# Patient Record
Sex: Male | Born: 1960 | Race: White | Hispanic: No | Marital: Married | Smoking: Former smoker
Health system: Southern US, Community
[De-identification: ages and names within clinical notes are randomized; demographics above are authoritative.]

## PROBLEM LIST (undated history)

## (undated) DIAGNOSIS — I639 Cerebral infarction, unspecified: Secondary | ICD-10-CM

## (undated) DIAGNOSIS — G4720 Circadian rhythm sleep disorder, unspecified type: Secondary | ICD-10-CM

## (undated) DIAGNOSIS — I10 Essential (primary) hypertension: Secondary | ICD-10-CM

## (undated) DIAGNOSIS — E299 Testicular dysfunction, unspecified: Secondary | ICD-10-CM

## (undated) DIAGNOSIS — D509 Iron deficiency anemia, unspecified: Secondary | ICD-10-CM

## (undated) DIAGNOSIS — G40909 Epilepsy, unspecified, not intractable, without status epilepticus: Secondary | ICD-10-CM

## (undated) DIAGNOSIS — F32A Depression, unspecified: Secondary | ICD-10-CM

## (undated) DIAGNOSIS — M961 Postlaminectomy syndrome, not elsewhere classified: Secondary | ICD-10-CM

## (undated) DIAGNOSIS — M419 Scoliosis, unspecified: Secondary | ICD-10-CM

## (undated) DIAGNOSIS — F319 Bipolar disorder, unspecified: Secondary | ICD-10-CM

## (undated) DIAGNOSIS — E785 Hyperlipidemia, unspecified: Secondary | ICD-10-CM

## (undated) DIAGNOSIS — I4891 Unspecified atrial fibrillation: Secondary | ICD-10-CM

## (undated) DIAGNOSIS — F419 Anxiety disorder, unspecified: Secondary | ICD-10-CM

## (undated) DIAGNOSIS — M5481 Occipital neuralgia: Secondary | ICD-10-CM

## (undated) DIAGNOSIS — G4730 Sleep apnea, unspecified: Secondary | ICD-10-CM

## (undated) DIAGNOSIS — M5134 Other intervertebral disc degeneration, thoracic region: Secondary | ICD-10-CM

## (undated) DIAGNOSIS — G894 Chronic pain syndrome: Secondary | ICD-10-CM

## (undated) HISTORY — PX: HERNIA REPAIR: SHX51

## (undated) HISTORY — PX: COLONOSCOPY: SHX174

## (undated) HISTORY — PX: CATARACT EXTRACTION W/ INTRAOCULAR LENS IMPLANT: SHX1309

## (undated) HISTORY — PX: CARDIAC CATHETERIZATION: SHX172

## (undated) HISTORY — PX: UPPER GASTROINTESTINAL ENDOSCOPY: SHX188

## (undated) HISTORY — PX: BACK SURGERY: SHX140

---

## 2021-10-21 ENCOUNTER — Other Ambulatory Visit: Payer: Self-pay | Admitting: Neurosurgery

## 2021-10-21 DIAGNOSIS — Z01818 Encounter for other preprocedural examination: Secondary | ICD-10-CM

## 2021-11-03 ENCOUNTER — Encounter
Admission: RE | Admit: 2021-11-03 | Discharge: 2021-11-03 | Disposition: A | Discharge status: Home / Self Care | Payer: Medicare Other | Source: Ambulatory Visit | Attending: Neurosurgery | Admitting: Neurosurgery

## 2021-11-03 ENCOUNTER — Encounter: Payer: Self-pay | Admitting: Neurosurgery

## 2021-11-03 ENCOUNTER — Other Ambulatory Visit: Payer: Self-pay

## 2021-11-03 HISTORY — DX: Anxiety disorder, unspecified: F41.9

## 2021-11-03 HISTORY — DX: Cerebral infarction, unspecified: I63.9

## 2021-11-03 HISTORY — DX: Unspecified atrial fibrillation: I48.91

## 2021-11-03 HISTORY — DX: Depression, unspecified: F32.A

## 2021-11-03 HISTORY — DX: Other intervertebral disc degeneration, thoracic region: M51.34

## 2021-11-03 HISTORY — DX: Sleep apnea, unspecified: G47.30

## 2021-11-03 HISTORY — DX: Epilepsy, unspecified, not intractable, without status epilepticus: G40.909

## 2021-11-03 HISTORY — DX: Hyperlipidemia, unspecified: E78.5

## 2021-11-03 NOTE — Patient Instructions (Addendum)
Your procedure is scheduled on: 11/14/21 Report to DAY SURGERY DEPARTMENT LOCATED ON 2ND FLOOR MEDICAL MALL ENTRANCE. To find out your arrival time please call [phone] between 1PM - 3PM on 11/11/21.  Remember: Instructions that are not followed completely may result in serious medical risk, up to and including death, or upon the discretion of your surgeon and anesthesiologist your surgery may need to be rescheduled.     _X__ 1. Do not eat food after midnight the night before your procedure.                 No gum chewing or hard candies. You may drink clear liquids up to 2 hours                 before you are scheduled to arrive for your surgery- DO not drink clear                 liquids within 2 hours of the start of your surgery.                 Clear Liquids include:  water, apple juice without pulp, clear carbohydrate                 drink such as Clearfast or Gatorade, Black Coffee or Tea (Do not add                 anything to coffee or tea). Diabetics water only  __X__2.  On the morning of surgery brush your teeth with toothpaste and water, you                 may rinse your mouth with mouthwash if you wish.  Do not swallow any              toothpaste of mouthwash.     _X__ 3.  No Alcohol for 24 hours before or after surgery.   _X__ 4.  Do Not Smoke or use e-cigarettes For 24 Hours Prior to Your Surgery.                 Do not use any chewable tobacco products for at least 6 hours prior to                 surgery.  ____  5.  Bring all medications with you on the day of surgery if instructed.   __X__  6.  Notify your doctor if there is any change in your medical condition      (cold, fever, infections).     Do not wear jewelry, make-up, hairpins, clips or nail polish. Do not wear lotions, powders, or perfumes. No deodorant Do not shave body hair 48 hours prior to surgery. Men may shave face and neck. Do not bring valuables to the hospital.    [facility] is not responsible  for any belongings or valuables.  Contacts, dentures/partials or body piercings may not be worn into surgery. Bring a case for your contacts, glasses or hearing aids, a denture cup will be supplied. Leave your suitcase in the car. After surgery it may be brought to your room. For patients admitted to the hospital, discharge time is determined by your treatment team.   Patients discharged the day of surgery will not be allowed to drive home.   Please read over the following fact sheets that you were given:     __X__ Take these medicines the morning of surgery with A SIP OF WATER:  1. atorvastatin (LIPITOR) 40 MG tablet  2. DULoxetine (CYMBALTA) 60 MG capsule  3. levETIRAcetam (KEPPRA) 500 MG tablet  4. Oxycodone HCl 10 MG TABS  5. hydrOXYzine (ATARAX) 25 MG tablet if needed  6.  ____ Fleet Enema (as directed)   __X__ Use CHG Soap/SAGE wipes as directed  Shower using "orange" bar Dial soap for 3-4 days and the night before and morning of surgery  ____ Use inhalers on the day of surgery  ____ Stop metformin/Janumet/Farxiga 2 days prior to surgery    ____ Take 1/2 of usual insulin dose the night before surgery. No insulin the morning          of surgery.   __X__ Stop Blood Thinners Coumadin/Plavix/Xarelto/Pleta/Pradaxa/Eliquis/Effient/Aspirin Take the last dose of Eliquis Friday am 2/24 then hold until able to resume after surgery (14 days)  __X__ Stop Anti-inflammatories 7 days before surgery such as Advil, Ibuprofen, Motrin,  BC or Goodies Powder, Naprosyn, Naproxen, Aleve, Aspirin    __X__ Stop all herbals and supplements, fish oil or vitamins  until after surgery.    ____ Bring C-Pap to the hospital.

## 2021-11-10 ENCOUNTER — Other Ambulatory Visit: Admission: RE | Admit: 2021-11-10 | Payer: Medicare Other | Source: Ambulatory Visit

## 2021-11-10 NOTE — Progress Notes (Signed)
Due to distance from the hospital, patient to get a rapid covid on the day of surgery 11/14/21

## 2021-11-14 ENCOUNTER — Encounter: Payer: Self-pay | Admitting: Neurosurgery

## 2021-11-14 ENCOUNTER — Other Ambulatory Visit: Payer: Self-pay

## 2021-11-14 ENCOUNTER — Inpatient Hospital Stay
Admission: RE | Admit: 2021-11-14 | Discharge: 2021-11-19 | DRG: 460 | Disposition: A | Discharge status: Home / Self Care | Payer: Medicare Other | Attending: Neurosurgery | Admitting: Neurosurgery

## 2021-11-14 ENCOUNTER — Inpatient Hospital Stay: Payer: Medicare Other | Admitting: Urgent Care

## 2021-11-14 ENCOUNTER — Inpatient Hospital Stay: Payer: Medicare Other

## 2021-11-14 ENCOUNTER — Encounter
Admission: RE | Disposition: A | Discharge status: Home / Self Care | Payer: Self-pay | Source: Home / Self Care | Attending: Neurosurgery

## 2021-11-14 DIAGNOSIS — Y798 Miscellaneous orthopedic devices associated with adverse incidents, not elsewhere classified: Secondary | ICD-10-CM | POA: Diagnosis present

## 2021-11-14 DIAGNOSIS — K5900 Constipation, unspecified: Secondary | ICD-10-CM | POA: Diagnosis not present

## 2021-11-14 DIAGNOSIS — T84226A Displacement of internal fixation device of vertebrae, initial encounter: Secondary | ICD-10-CM | POA: Diagnosis present

## 2021-11-14 DIAGNOSIS — M960 Pseudarthrosis after fusion or arthrodesis: Secondary | ICD-10-CM | POA: Diagnosis present

## 2021-11-14 DIAGNOSIS — Z20822 Contact with and (suspected) exposure to covid-19: Secondary | ICD-10-CM | POA: Diagnosis present

## 2021-11-14 DIAGNOSIS — M4314 Spondylolisthesis, thoracic region: Secondary | ICD-10-CM | POA: Diagnosis present

## 2021-11-14 DIAGNOSIS — Z885 Allergy status to narcotic agent status: Secondary | ICD-10-CM | POA: Diagnosis not present

## 2021-11-14 DIAGNOSIS — Y838 Other surgical procedures as the cause of abnormal reaction of the patient, or of later complication, without mention of misadventure at the time of the procedure: Secondary | ICD-10-CM | POA: Diagnosis present

## 2021-11-14 DIAGNOSIS — Z888 Allergy status to other drugs, medicaments and biological substances status: Secondary | ICD-10-CM

## 2021-11-14 DIAGNOSIS — Z981 Arthrodesis status: Secondary | ICD-10-CM

## 2021-11-14 DIAGNOSIS — Z419 Encounter for procedure for purposes other than remedying health state, unspecified: Secondary | ICD-10-CM

## 2021-11-14 HISTORY — DX: Postlaminectomy syndrome, not elsewhere classified: M96.1

## 2021-11-14 HISTORY — DX: Circadian rhythm sleep disorder, unspecified type: G47.20

## 2021-11-14 HISTORY — DX: Scoliosis, unspecified: M41.9

## 2021-11-14 HISTORY — DX: Bipolar disorder, unspecified: F31.9

## 2021-11-14 HISTORY — PX: APPLICATION OF INTRAOPERATIVE CT SCAN: SHX6668

## 2021-11-14 HISTORY — DX: Chronic pain syndrome: G89.4

## 2021-11-14 HISTORY — DX: Occipital neuralgia: M54.81

## 2021-11-14 HISTORY — DX: Iron deficiency anemia, unspecified: D50.9

## 2021-11-14 HISTORY — DX: Testicular dysfunction, unspecified: E29.9

## 2021-11-14 HISTORY — DX: Essential (primary) hypertension: I10

## 2021-11-14 LAB — PROTIME-INR
INR: 1.1 (ref 0.8–1.2)
Prothrombin Time: 14.2 seconds (ref 11.4–15.2)

## 2021-11-14 LAB — SURGICAL PCR SCREEN
MRSA, PCR: NEGATIVE
Staphylococcus aureus: POSITIVE — AB

## 2021-11-14 LAB — TYPE AND SCREEN
ABO/RH(D): A POS
Antibody Screen: NEGATIVE

## 2021-11-14 LAB — ABO/RH: ABO/RH(D): A POS

## 2021-11-14 LAB — SARS CORONAVIRUS 2 BY RT PCR (HOSPITAL ORDER, PERFORMED IN ~~LOC~~ HOSPITAL LAB): SARS Coronavirus 2: NEGATIVE

## 2021-11-14 LAB — APTT: aPTT: 33 seconds (ref 24–36)

## 2021-11-14 SURGERY — POSTERIOR THORACIC FUSION 4 LEVELS
Anesthesia: General | Site: Spine Lumbar

## 2021-11-14 MED ORDER — SUGAMMADEX SODIUM 200 MG/2ML IV SOLN
INTRAVENOUS | Status: DC | PRN
  Administered 2021-11-14: 400 mg via INTRAVENOUS

## 2021-11-14 MED ORDER — DULOXETINE HCL 30 MG PO CPEP
60.0000 mg | ORAL_CAPSULE | Freq: Two times a day (BID) | ORAL | Status: DC
  Administered 2021-11-14 – 2021-11-18 (×9): 60 mg via ORAL
  Filled 2021-11-14 (×9): qty 2

## 2021-11-14 MED ORDER — SODIUM CHLORIDE 0.9% FLUSH: 3.0000 mL | INTRAVENOUS | Status: DC | PRN

## 2021-11-14 MED ORDER — ONDANSETRON HCL 4 MG PO TABS: 4.0000 mg | ORAL_TABLET | Freq: Four times a day (QID) | ORAL | Status: DC | PRN

## 2021-11-14 MED ORDER — OXYCODONE HCL 5 MG PO TABS: 10.0000 mg | ORAL_TABLET | ORAL | Status: DC | PRN

## 2021-11-14 MED ORDER — CHLORHEXIDINE GLUCONATE 0.12 % MT SOLN: 15.0000 mL | Freq: Once | OROMUCOSAL | Status: AC

## 2021-11-14 MED ORDER — SODIUM CHLORIDE 0.9 % IV SOLN: INTRAVENOUS | Status: DC

## 2021-11-14 MED ORDER — MIDAZOLAM HCL 2 MG/2ML IJ SOLN
INTRAMUSCULAR | Status: AC
  Filled 2021-11-14: qty 2

## 2021-11-14 MED ORDER — BUPIVACAINE-EPINEPHRINE (PF) 0.5% -1:200000 IJ SOLN
INTRAMUSCULAR | Status: AC
  Filled 2021-11-14: qty 30

## 2021-11-14 MED ORDER — FLEET ENEMA 7-19 GM/118ML RE ENEM
1.0000 | ENEMA | Freq: Once | RECTAL | Status: AC | PRN
  Administered 2021-11-17: 1 via RECTAL

## 2021-11-14 MED ORDER — CHLORHEXIDINE GLUCONATE 0.12 % MT SOLN
OROMUCOSAL | Status: AC
  Administered 2021-11-14: 15 mL via OROMUCOSAL
  Filled 2021-11-14: qty 15

## 2021-11-14 MED ORDER — CEFAZOLIN SODIUM-DEXTROSE 2-4 GM/100ML-% IV SOLN
2.0000 g | INTRAVENOUS | Status: AC
  Administered 2021-11-14: 2 g via INTRAVENOUS

## 2021-11-14 MED ORDER — KETAMINE HCL 50 MG/5ML IJ SOSY
PREFILLED_SYRINGE | INTRAMUSCULAR | Status: AC
  Filled 2021-11-14: qty 5

## 2021-11-14 MED ORDER — SODIUM CHLORIDE 0.9 % IV SOLN: 250.0000 mL | INTRAVENOUS | Status: DC

## 2021-11-14 MED ORDER — LIDOCAINE HCL (PF) 2 % IJ SOLN
INTRAMUSCULAR | Status: AC
  Filled 2021-11-14: qty 5

## 2021-11-14 MED ORDER — ACETAMINOPHEN 500 MG PO TABS
1000.0000 mg | ORAL_TABLET | Freq: Four times a day (QID) | ORAL | Status: AC
  Administered 2021-11-14 – 2021-11-15 (×4): 1000 mg via ORAL
  Filled 2021-11-14 (×4): qty 2

## 2021-11-14 MED ORDER — ONDANSETRON HCL 4 MG/2ML IJ SOLN
INTRAMUSCULAR | Status: AC
  Filled 2021-11-14: qty 2

## 2021-11-14 MED ORDER — BISACODYL 10 MG RE SUPP
10.0000 mg | Freq: Every day | RECTAL | Status: DC | PRN
  Administered 2021-11-18: 10 mg via RECTAL
  Filled 2021-11-14: qty 1

## 2021-11-14 MED ORDER — FENTANYL CITRATE (PF) 100 MCG/2ML IJ SOLN: 25.0000 ug | INTRAMUSCULAR | Status: DC | PRN

## 2021-11-14 MED ORDER — DEXMEDETOMIDINE (PRECEDEX) IN NS 20 MCG/5ML (4 MCG/ML) IV SYRINGE
PREFILLED_SYRINGE | INTRAVENOUS | Status: AC
  Filled 2021-11-14: qty 5

## 2021-11-14 MED ORDER — SODIUM CHLORIDE 0.9% FLUSH
3.0000 mL | Freq: Two times a day (BID) | INTRAVENOUS | Status: DC
  Administered 2021-11-14 – 2021-11-18 (×8): 3 mL via INTRAVENOUS

## 2021-11-14 MED ORDER — LACTATED RINGERS IV SOLN: INTRAVENOUS | Status: DC

## 2021-11-14 MED ORDER — FAMOTIDINE 20 MG PO TABS
ORAL_TABLET | ORAL | Status: AC
Start: 2021-11-14 — End: 2021-11-14
  Administered 2021-11-14: 20 mg via ORAL
  Filled 2021-11-14: qty 1

## 2021-11-14 MED ORDER — ORAL CARE MOUTH RINSE: 15.0000 mL | Freq: Once | OROMUCOSAL | Status: AC

## 2021-11-14 MED ORDER — SUCCINYLCHOLINE CHLORIDE 200 MG/10ML IV SOSY
PREFILLED_SYRINGE | INTRAVENOUS | Status: DC | PRN
  Administered 2021-11-14: 100 mg via INTRAVENOUS

## 2021-11-14 MED ORDER — SODIUM CHLORIDE (PF) 0.9 % IJ SOLN
INTRAMUSCULAR | Status: DC | PRN
  Administered 2021-11-14: 60 mL via PERINEURAL

## 2021-11-14 MED ORDER — ATORVASTATIN CALCIUM 20 MG PO TABS
40.0000 mg | ORAL_TABLET | Freq: Every morning | ORAL | Status: DC
  Administered 2021-11-15 – 2021-11-19 (×5): 40 mg via ORAL
  Filled 2021-11-14 (×5): qty 2

## 2021-11-14 MED ORDER — PHENYLEPHRINE 40 MCG/ML (10ML) SYRINGE FOR IV PUSH (FOR BLOOD PRESSURE SUPPORT)
PREFILLED_SYRINGE | INTRAVENOUS | Status: DC | PRN
  Administered 2021-11-14 (×2): 80 ug via INTRAVENOUS

## 2021-11-14 MED ORDER — LIDOCAINE HCL (PF) 2 % IJ SOLN
INTRAMUSCULAR | Status: AC
  Filled 2021-11-14: qty 10

## 2021-11-14 MED ORDER — VANCOMYCIN HCL 1500 MG/300ML IV SOLN
1500.0000 mg | INTRAVENOUS | Status: AC
  Administered 2021-11-14: 1500 mg via INTRAVENOUS
  Filled 2021-11-14: qty 300

## 2021-11-14 MED ORDER — LIDOCAINE HCL (CARDIAC) PF 100 MG/5ML IV SOSY
PREFILLED_SYRINGE | INTRAVENOUS | Status: DC | PRN
Start: 2021-11-14 — End: 2021-11-14
  Administered 2021-11-14 (×2): 100 mg via INTRAVENOUS

## 2021-11-14 MED ORDER — TRAZODONE HCL 100 MG PO TABS
200.0000 mg | ORAL_TABLET | Freq: Every day | ORAL | Status: DC
  Administered 2021-11-14 – 2021-11-18 (×5): 200 mg via ORAL
  Filled 2021-11-14 (×5): qty 2

## 2021-11-14 MED ORDER — HYDROCHLOROTHIAZIDE 25 MG PO TABS
25.0000 mg | ORAL_TABLET | Freq: Every morning | ORAL | Status: DC
  Administered 2021-11-15 – 2021-11-19 (×5): 25 mg via ORAL
  Filled 2021-11-14 (×5): qty 1

## 2021-11-14 MED ORDER — SURGIFLO WITH THROMBIN (HEMOSTATIC MATRIX KIT) OPTIME
TOPICAL | Status: DC | PRN
  Administered 2021-11-14: 1 via TOPICAL

## 2021-11-14 MED ORDER — ARIPIPRAZOLE 2 MG PO TABS
20.0000 mg | ORAL_TABLET | Freq: Every day | ORAL | Status: DC
  Administered 2021-11-14 – 2021-11-17 (×4): 20 mg via ORAL
  Filled 2021-11-14 (×5): qty 10

## 2021-11-14 MED ORDER — POLYETHYLENE GLYCOL 3350 17 G PO PACK
17.0000 g | PACK | Freq: Every day | ORAL | Status: DC | PRN
  Administered 2021-11-15 – 2021-11-17 (×3): 17 g via ORAL
  Filled 2021-11-14 (×3): qty 1

## 2021-11-14 MED ORDER — SENNA 8.6 MG PO TABS
1.0000 | ORAL_TABLET | Freq: Two times a day (BID) | ORAL | Status: DC
  Administered 2021-11-14 – 2021-11-18 (×9): 8.6 mg via ORAL
  Filled 2021-11-14 (×9): qty 1

## 2021-11-14 MED ORDER — BUPIVACAINE HCL (PF) 0.5 % IJ SOLN
INTRAMUSCULAR | Status: AC
  Filled 2021-11-14: qty 30

## 2021-11-14 MED ORDER — VANCOMYCIN HCL 1000 MG IV SOLR
INTRAVENOUS | Status: DC | PRN
  Administered 2021-11-14: 1 g

## 2021-11-14 MED ORDER — HYDROMORPHONE HCL 1 MG/ML IJ SOLN
INTRAMUSCULAR | Status: AC
Start: 2021-11-14 — End: ?
  Filled 2021-11-14: qty 1

## 2021-11-14 MED ORDER — 0.9 % SODIUM CHLORIDE (POUR BTL) OPTIME
TOPICAL | Status: DC | PRN
  Administered 2021-11-14: 1000 mL

## 2021-11-14 MED ORDER — FENTANYL CITRATE (PF) 100 MCG/2ML IJ SOLN
INTRAMUSCULAR | Status: AC
  Filled 2021-11-14: qty 2

## 2021-11-14 MED ORDER — ROCURONIUM BROMIDE 100 MG/10ML IV SOLN
INTRAVENOUS | Status: DC | PRN
Start: 2021-11-14 — End: 2021-11-14
  Administered 2021-11-14: 20 mg via INTRAVENOUS
  Administered 2021-11-14: 10 mg via INTRAVENOUS
  Administered 2021-11-14: 30 mg via INTRAVENOUS
  Administered 2021-11-14: 50 mg via INTRAVENOUS
  Administered 2021-11-14 (×2): 10 mg via INTRAVENOUS

## 2021-11-14 MED ORDER — ROCURONIUM BROMIDE 10 MG/ML (PF) SYRINGE
PREFILLED_SYRINGE | INTRAVENOUS | Status: AC
  Filled 2021-11-14: qty 10

## 2021-11-14 MED ORDER — SODIUM CHLORIDE FLUSH 0.9 % IV SOLN
INTRAVENOUS | Status: AC
  Filled 2021-11-14: qty 20

## 2021-11-14 MED ORDER — CEFAZOLIN SODIUM-DEXTROSE 2-4 GM/100ML-% IV SOLN
INTRAVENOUS | Status: AC
  Filled 2021-11-14: qty 100

## 2021-11-14 MED ORDER — LIDOCAINE HCL (PF) 2 % IJ SOLN
INTRAMUSCULAR | Status: DC | PRN
  Administered 2021-11-14: .923 mg/kg/h via INTRADERMAL

## 2021-11-14 MED ORDER — HYDROMORPHONE HCL 1 MG/ML IJ SOLN
INTRAMUSCULAR | Status: AC
  Administered 2021-11-14: 0.5 mg via INTRAVENOUS
  Filled 2021-11-14: qty 1

## 2021-11-14 MED ORDER — ONDANSETRON HCL 4 MG/2ML IJ SOLN
INTRAMUSCULAR | Status: DC | PRN
  Administered 2021-11-14: 4 mg via INTRAVENOUS

## 2021-11-14 MED ORDER — PROPOFOL 10 MG/ML IV BOLUS
INTRAVENOUS | Status: AC
  Filled 2021-11-14: qty 20

## 2021-11-14 MED ORDER — OXYCODONE HCL 5 MG PO TABS
15.0000 mg | ORAL_TABLET | ORAL | Status: DC | PRN
  Administered 2021-11-14 – 2021-11-16 (×6): 15 mg via ORAL
  Filled 2021-11-14 (×6): qty 3

## 2021-11-14 MED ORDER — SEVOFLURANE IN SOLN
RESPIRATORY_TRACT | Status: AC
  Filled 2021-11-14: qty 250

## 2021-11-14 MED ORDER — PHENYLEPHRINE HCL (PRESSORS) 10 MG/ML IV SOLN
INTRAVENOUS | Status: AC
  Filled 2021-11-14: qty 1

## 2021-11-14 MED ORDER — EPHEDRINE SULFATE (PRESSORS) 50 MG/ML IJ SOLN
INTRAMUSCULAR | Status: DC | PRN
Start: 2021-11-14 — End: 2021-11-14
  Administered 2021-11-14: 10 mg via INTRAVENOUS

## 2021-11-14 MED ORDER — BUPIVACAINE-EPINEPHRINE (PF) 0.5% -1:200000 IJ SOLN
INTRAMUSCULAR | Status: DC | PRN
  Administered 2021-11-14: 9.5 mL

## 2021-11-14 MED ORDER — ONDANSETRON HCL 4 MG/2ML IJ SOLN: 4.0000 mg | Freq: Four times a day (QID) | INTRAMUSCULAR | Status: DC | PRN

## 2021-11-14 MED ORDER — FENTANYL CITRATE (PF) 100 MCG/2ML IJ SOLN
INTRAMUSCULAR | Status: DC | PRN
  Administered 2021-11-14 (×2): 50 ug via INTRAVENOUS

## 2021-11-14 MED ORDER — SODIUM CHLORIDE 0.9 % IR SOLN
Status: DC | PRN
  Administered 2021-11-14: 1000 mL

## 2021-11-14 MED ORDER — FAMOTIDINE 20 MG PO TABS: 20.0000 mg | ORAL_TABLET | Freq: Once | ORAL | Status: AC

## 2021-11-14 MED ORDER — METHOCARBAMOL 500 MG PO TABS
500.0000 mg | ORAL_TABLET | Freq: Four times a day (QID) | ORAL | Status: DC
  Administered 2021-11-15 – 2021-11-19 (×18): 500 mg via ORAL
  Filled 2021-11-14 (×18): qty 1

## 2021-11-14 MED ORDER — PHENYLEPHRINE HCL-NACL 20-0.9 MG/250ML-% IV SOLN
INTRAVENOUS | Status: DC | PRN
  Administered 2021-11-14: 75 ug/min via INTRAVENOUS

## 2021-11-14 MED ORDER — KETOROLAC TROMETHAMINE 15 MG/ML IJ SOLN
INTRAMUSCULAR | Status: AC
  Filled 2021-11-14: qty 1

## 2021-11-14 MED ORDER — PRONTOSAN WOUND IRRIGATION OPTIME
TOPICAL | Status: DC | PRN
  Administered 2021-11-14: 1

## 2021-11-14 MED ORDER — ONDANSETRON HCL 4 MG/2ML IJ SOLN: 4.0000 mg | Freq: Once | INTRAMUSCULAR | Status: DC | PRN

## 2021-11-14 MED ORDER — DEXAMETHASONE SODIUM PHOSPHATE 10 MG/ML IJ SOLN
INTRAMUSCULAR | Status: DC | PRN
  Administered 2021-11-14: 10 mg via INTRAVENOUS

## 2021-11-14 MED ORDER — DEXAMETHASONE SODIUM PHOSPHATE 10 MG/ML IJ SOLN
INTRAMUSCULAR | Status: AC
  Filled 2021-11-14: qty 1

## 2021-11-14 MED ORDER — PHENOL 1.4 % MT LIQD
1.0000 | OROMUCOSAL | Status: DC | PRN
  Filled 2021-11-14: qty 177

## 2021-11-14 MED ORDER — HYDROXYZINE HCL 25 MG PO TABS
25.0000 mg | ORAL_TABLET | Freq: Three times a day (TID) | ORAL | Status: DC | PRN
  Administered 2021-11-15: 25 mg via ORAL
  Filled 2021-11-14 (×2): qty 1

## 2021-11-14 MED ORDER — HYDROMORPHONE HCL 1 MG/ML IJ SOLN
1.0000 mg | INTRAMUSCULAR | Status: DC | PRN
  Administered 2021-11-14 – 2021-11-16 (×6): 1 mg via INTRAVENOUS
  Filled 2021-11-14 (×6): qty 1

## 2021-11-14 MED ORDER — KETAMINE HCL 10 MG/ML IJ SOLN
INTRAMUSCULAR | Status: DC | PRN
  Administered 2021-11-14 (×2): 10 mg via INTRAVENOUS
  Administered 2021-11-14: 5 mg via INTRAVENOUS
  Administered 2021-11-14 (×2): 10 mg via INTRAVENOUS
  Administered 2021-11-14: 5 mg via INTRAVENOUS

## 2021-11-14 MED ORDER — GLYCOPYRROLATE 0.2 MG/ML IJ SOLN
INTRAMUSCULAR | Status: AC
  Filled 2021-11-14: qty 1

## 2021-11-14 MED ORDER — KETOROLAC TROMETHAMINE 15 MG/ML IJ SOLN
15.0000 mg | Freq: Four times a day (QID) | INTRAMUSCULAR | Status: AC
  Administered 2021-11-14 – 2021-11-15 (×3): 15 mg via INTRAVENOUS
  Filled 2021-11-14 (×2): qty 1

## 2021-11-14 MED ORDER — VANCOMYCIN HCL 1000 MG IV SOLR
INTRAVENOUS | Status: AC
  Filled 2021-11-14: qty 20

## 2021-11-14 MED ORDER — MENTHOL 3 MG MT LOZG
1.0000 | LOZENGE | OROMUCOSAL | Status: DC | PRN
  Filled 2021-11-14: qty 9

## 2021-11-14 MED ORDER — LEVETIRACETAM 500 MG PO TABS
500.0000 mg | ORAL_TABLET | Freq: Two times a day (BID) | ORAL | Status: DC
  Administered 2021-11-14 – 2021-11-18 (×9): 500 mg via ORAL
  Filled 2021-11-14 (×10): qty 1

## 2021-11-14 MED ORDER — MIDAZOLAM HCL 2 MG/2ML IJ SOLN
INTRAMUSCULAR | Status: DC | PRN
  Administered 2021-11-14: 2 mg via INTRAVENOUS

## 2021-11-14 MED ORDER — PROPOFOL 10 MG/ML IV BOLUS
INTRAVENOUS | Status: DC | PRN
  Administered 2021-11-14: 150 mg via INTRAVENOUS

## 2021-11-14 MED ORDER — BUPIVACAINE LIPOSOME 1.3 % IJ SUSP
INTRAMUSCULAR | Status: AC
  Filled 2021-11-14: qty 20

## 2021-11-14 MED ORDER — HYDROMORPHONE HCL 1 MG/ML IJ SOLN
INTRAMUSCULAR | Status: DC | PRN
  Administered 2021-11-14: 1 mg via INTRAVENOUS

## 2021-11-14 MED ORDER — HYDROMORPHONE HCL 1 MG/ML IJ SOLN
0.2500 mg | INTRAMUSCULAR | Status: DC | PRN
  Administered 2021-11-14 (×2): 0.5 mg via INTRAVENOUS

## 2021-11-14 MED ORDER — METHOCARBAMOL 1000 MG/10ML IJ SOLN
500.0000 mg | Freq: Four times a day (QID) | INTRAVENOUS | Status: DC
  Administered 2021-11-14: 500 mg via INTRAVENOUS
  Filled 2021-11-14 (×2): qty 5

## 2021-11-14 MED ORDER — GLYCOPYRROLATE PF 0.2 MG/ML IJ SOSY
PREFILLED_SYRINGE | INTRAMUSCULAR | Status: DC | PRN
  Administered 2021-11-14: .1 mg via INTRAVENOUS

## 2021-11-14 MED ORDER — SUCCINYLCHOLINE CHLORIDE 200 MG/10ML IV SOSY
PREFILLED_SYRINGE | INTRAVENOUS | Status: AC
  Filled 2021-11-14: qty 10

## 2021-11-14 MED ORDER — ENOXAPARIN SODIUM 40 MG/0.4ML IJ SOSY
40.0000 mg | PREFILLED_SYRINGE | INTRAMUSCULAR | Status: DC
  Administered 2021-11-15 – 2021-11-18 (×4): 40 mg via SUBCUTANEOUS
  Filled 2021-11-14 (×5): qty 0.4

## 2021-11-14 MED ORDER — DEXMEDETOMIDINE (PRECEDEX) IN NS 20 MCG/5ML (4 MCG/ML) IV SYRINGE
PREFILLED_SYRINGE | INTRAVENOUS | Status: DC | PRN
  Administered 2021-11-14 (×4): 4 ug via INTRAVENOUS

## 2021-11-14 SURGICAL SUPPLY — 80 items
ALLOGRAFT BONE FIBER KORE 10CC (Bone Implant) ×2 IMPLANT
BONE CANC CHIPS 20CC PCAN1/4 (Bone Implant) ×2 IMPLANT
BONE CANC CHIPS 40CC CAN1/2 (Bone Implant) ×2 IMPLANT
BUR NEURO DRILL SOFT 3.0X3.8M (BURR) ×2 IMPLANT
CAP LOCKING THREADED (Cap) ×16 IMPLANT
CHIPS CANC BONE 20CC PCAN1/4 (Bone Implant) ×1 IMPLANT
CHIPS CANC BONE 40CC CAN1/2 (Bone Implant) ×1 IMPLANT
CHLORAPREP W/TINT 26 (MISCELLANEOUS) ×4 IMPLANT
CNTNR SPEC 2.5X3XGRAD LEK (MISCELLANEOUS) ×1
CONNECTOR DH LAT 5.5-6 12 WIDE (Connector) ×2 IMPLANT
CONNECTOR HEAD TO 5.5-6.35 12 (Connector) ×1 IMPLANT
CONT SPEC 4OZ STER OR WHT (MISCELLANEOUS) ×1
CONTAINER SPEC 2.5X3XGRAD LEK (MISCELLANEOUS) ×1 IMPLANT
COUNTER NEEDLE 20/40 LG (NEEDLE) ×2 IMPLANT
DERMABOND ADVANCED (GAUZE/BANDAGES/DRESSINGS) ×1
DERMABOND ADVANCED .7 DNX12 (GAUZE/BANDAGES/DRESSINGS) ×1 IMPLANT
DRAPE 3D C-ARM OEC (DRAPES) IMPLANT
DRAPE C ARM PK CFD 31 SPINE (DRAPES) ×1 IMPLANT
DRAPE C-ARMOR (DRAPES) IMPLANT
DRAPE LAPAROTOMY 100X77 ABD (DRAPES) ×2 IMPLANT
DRAPE MICROSCOPE SPINE 48X150 (DRAPES) ×2 IMPLANT
DRAPE SCAN PATIENT (DRAPES) ×2 IMPLANT
DRSG TEGADERM 4X4.75 (GAUZE/BANDAGES/DRESSINGS) ×2 IMPLANT
ELECT CAUTERY BLADE TIP 2.5 (TIP) ×2
ELECT EZSTD 165MM 6.5IN (MISCELLANEOUS)
ELECT REM PT RETURN 9FT ADLT (ELECTROSURGICAL) ×2
ELECTRODE CAUTERY BLDE TIP 2.5 (TIP) ×1 IMPLANT
ELECTRODE EZSTD 165MM 6.5IN (MISCELLANEOUS) IMPLANT
ELECTRODE REM PT RTRN 9FT ADLT (ELECTROSURGICAL) ×1 IMPLANT
GAUZE 4X4 16PLY ~~LOC~~+RFID DBL (SPONGE) ×2 IMPLANT
GLOVE SURG SYN 6.5 ES PF (GLOVE) ×4 IMPLANT
GLOVE SURG SYN 6.5 PF PI (GLOVE) ×2 IMPLANT
GLOVE SURG SYN 8.5  E (GLOVE) ×3
GLOVE SURG SYN 8.5 E (GLOVE) ×3 IMPLANT
GLOVE SURG SYN 8.5 PF PI (GLOVE) ×3 IMPLANT
GLOVE SURG UNDER POLY LF SZ6.5 (GLOVE) ×2 IMPLANT
GLOVE SURG UNDER POLY LF SZ8.5 (GLOVE) ×2 IMPLANT
GOWN SRG LRG LVL 4 IMPRV REINF (GOWNS) ×1 IMPLANT
GOWN SRG XL LVL 3 NONREINFORCE (GOWNS) ×1 IMPLANT
GOWN STRL NON-REIN TWL XL LVL3 (GOWNS) ×1
GOWN STRL REIN LRG LVL4 (GOWNS) ×1
GRADUATE 1200CC STRL 31836 (MISCELLANEOUS) ×2 IMPLANT
GRAFT BNE CANC CHIPS 1-8 20CC (Bone Implant) IMPLANT
GRAFT BNE CHIP CANC 1-8 40 (Bone Implant) IMPLANT
HEMOVAC 400CC 10FR (MISCELLANEOUS) ×2 IMPLANT
IV NS 1000ML (IV SOLUTION) ×1
IV NS 1000ML BAXH (IV SOLUTION) IMPLANT
KIT INFUSE LRG II (Orthopedic Implant) ×1 IMPLANT
KIT INFUSE MEDIUM (Orthopedic Implant) ×1 IMPLANT
KIT SPINAL PRONEVIEW (KITS) ×2 IMPLANT
MANIFOLD NEPTUNE II (INSTRUMENTS) ×2 IMPLANT
MARKER SKIN DUAL TIP RULER LAB (MISCELLANEOUS) ×3 IMPLANT
MARKER SPHERE PSV REFLC 13MM (MARKER) ×13 IMPLANT
NDL SAFETY ECLIPSE 18X1.5 (NEEDLE) ×1 IMPLANT
NEEDLE HYPO 18GX1.5 SHARP (NEEDLE) ×1
NEEDLE HYPO 22GX1.5 SAFETY (NEEDLE) ×2 IMPLANT
NS IRRIG 1000ML POUR BTL (IV SOLUTION) ×2 IMPLANT
PACK LAMINECTOMY NEURO (CUSTOM PROCEDURE TRAY) ×2 IMPLANT
PAD ARMBOARD 7.5X6 YLW CONV (MISCELLANEOUS) ×2 IMPLANT
PREVENA INCISION MGT 90 150 (MISCELLANEOUS) ×1 IMPLANT
ROD INTEGRATED INLINE 5.5 (Rod) ×1 IMPLANT
SCREW CREO SPINAL 6.5X40 (Screw) ×8 IMPLANT
SEALER BIPOLAR AQUA 6.0 (INSTRUMENTS) ×1 IMPLANT
SOLUTION PRONTOSAN WOUND 350ML (IRRIGATION / IRRIGATOR) ×1 IMPLANT
SPONGE GAUZE 2X2 8PLY STRL LF (GAUZE/BANDAGES/DRESSINGS) ×2 IMPLANT
SURGIFLO W/THROMBIN 8M KIT (HEMOSTASIS) ×2 IMPLANT
SUT DVC VLOC 3-0 CL 6 P-12 (SUTURE) ×2 IMPLANT
SUT ETHILON 3-0 FS-10 30 BLK (SUTURE) ×4
SUT V-LOC 90 ABS DVC 3-0 CL (SUTURE) ×1 IMPLANT
SUT VIC AB 0 CT1 18XCR BRD 8 (SUTURE) IMPLANT
SUT VIC AB 0 CT1 27 (SUTURE) ×2
SUT VIC AB 0 CT1 27XCR 8 STRN (SUTURE) ×2 IMPLANT
SUT VIC AB 0 CT1 8-18 (SUTURE) ×2
SUT VIC AB 2-0 CT1 18 (SUTURE) ×6 IMPLANT
SUTURE EHLN 3-0 FS-10 30 BLK (SUTURE) IMPLANT
SYR 10ML LL (SYRINGE) ×2 IMPLANT
SYR 20ML LL LF (SYRINGE) ×2 IMPLANT
SYR 30ML LL (SYRINGE) ×4 IMPLANT
TOWEL OR 17X26 4PK STRL BLUE (TOWEL DISPOSABLE) ×6 IMPLANT
TUBING CONNECTING 10 (TUBING) ×2 IMPLANT

## 2021-11-14 NOTE — Transfer of Care (Signed)
Immediate Anesthesia Transfer of Care Note  Patient: Bryce Williams  Procedure(s) Performed: T4-L3 POSTERIOR SPINAL FUSION (Spine Lumbar) APPLICATION OF INTRAOPERATIVE CT SCAN (Spine Lumbar)  Patient Location: PACU  Anesthesia Type:General  Level of Consciousness: sedated  Airway & Oxygen Therapy: Patient Spontanous Breathing and Patient connected to face mask oxygen  Post-op Assessment: Report given to RN and Post -op Vital signs reviewed and stable  Post vital signs: Reviewed and stable  Last Vitals:  Vitals Value Taken Time  BP 120/83 11/14/21 1802  Temp 36 C 11/14/21 1802  Pulse 87 11/14/21 1808  Resp 15 11/14/21 1808  SpO2 99 % 11/14/21 1808  Vitals shown include unvalidated device data.  Last Pain:  Vitals:   11/14/21 1151  TempSrc: Temporal  PainSc: 4          Complications: No notable events documented.

## 2021-11-14 NOTE — Anesthesia Procedure Notes (Signed)
Procedure Name: Intubation Date/Time: 11/14/2021 2:10 PM Performed by: Morene Crocker, CRNA Pre-anesthesia Checklist: Patient identified, Emergency Drugs available, Suction available and Patient being monitored Patient Re-evaluated:Patient Re-evaluated prior to induction Oxygen Delivery Method: Circle system utilized Preoxygenation: Pre-oxygenation with 100% oxygen Induction Type: IV induction Ventilation: Mask ventilation without difficulty Laryngoscope Size: McGraph and 3 Grade View: Grade I Tube type: Oral Tube size: 7.5 mm Number of attempts: 1 Airway Equipment and Method: Stylet and Oral airway Placement Confirmation: ETT inserted through vocal cords under direct vision, positive ETCO2 and breath sounds checked- equal and bilateral Secured at: 22 cm Tube secured with: Tape Dental Injury: Teeth and Oropharynx as per pre-operative assessment

## 2021-11-14 NOTE — H&P (Signed)
I have reviewed and confirmed my history and physical from 10/18/2021 with no additions or changes. Plan for extension and revision of his thoracolumbar fusion due to proximal junctional failuare..  Risks and benefits reviewed.  Heart sounds normal no MRG. Chest Clear to Auscultation Bilaterally.

## 2021-11-14 NOTE — Anesthesia Preprocedure Evaluation (Signed)
Anesthesia Evaluation  Patient identified by MRN, date of birth, ID band Patient awake    Reviewed: Allergy & Precautions, H&P , NPO status , Patient's Chart, lab work & pertinent test results, reviewed documented beta blocker date and time   History of Anesthesia Complications Negative for: history of anesthetic complications  Airway Mallampati: III  TM Distance: >3 FB Neck ROM: full  Mouth opening: Limited Mouth Opening  Dental  (+) Dental Advidsory Given, Teeth Intact   Pulmonary neg pulmonary ROS, neg shortness of breath, sleep apnea , neg COPD, neg recent URI, former smoker,    Pulmonary exam normal breath sounds clear to auscultation       Cardiovascular Exercise Tolerance: Good hypertension, (-) angina(-) Past MI and (-) Cardiac Stents + dysrhythmias Atrial Fibrillation (-) Valvular Problems/Murmurs Rhythm:regular Rate:Normal     Neuro/Psych Seizures -,  PSYCHIATRIC DISORDERS Anxiety Depression Bipolar Disorder CVA, No Residual Symptoms    GI/Hepatic Neg liver ROS, GERD  ,  Endo/Other  negative endocrine ROS  Renal/GU negative Renal ROS  negative genitourinary   Musculoskeletal   Abdominal   Peds  Hematology negative hematology ROS (+)   Anesthesia Other Findings Past Medical History: No date: A-fib (HCC)     Comment:  a.) CHA2DS2-VASc = 3 (HTN, CVA x2). b.) rate/rhythm               maintained without pharmacological intervention;               chronically anticoagulated with apixaban. No date: Acquired scoliosis No date: Anxiety No date: Bipolar disorder (HCC) No date: Chronic pain syndrome No date: Circadian rhythm disorder No date: DDD (degenerative disc disease), thoracic No date: Depression No date: Failed back syndrome No date: HLD (hyperlipidemia) No date: HTN (hypertension) No date: IDA (iron deficiency anemia) No date: Occipital neuralgia of right side No date: Seizure disorder (HCC) No  date: Sleep apnea     Comment:  a.) does not require nocturnal PAP therapy No date: Stroke (HCC) No date: Testicular dysfunction   Reproductive/Obstetrics negative OB ROS                             Anesthesia Physical Anesthesia Plan  ASA: 3  Anesthesia Plan: General   Post-op Pain Management:    Induction: Intravenous  PONV Risk Score and Plan: Ondansetron, Dexamethasone, Propofol infusion, TIVA, Midazolam and Treatment may vary due to age or medical condition  Airway Management Planned: Oral ETT  Additional Equipment:   Intra-op Plan:   Post-operative Plan: Extubation in OR  Informed Consent: I have reviewed the patients History and Physical, chart, labs and discussed the procedure including the risks, benefits and alternatives for the proposed anesthesia with the patient or authorized representative who has indicated his/her understanding and acceptance.     Dental Advisory Given  Plan Discussed with: Anesthesiologist, CRNA and Surgeon  Anesthesia Plan Comments:         Anesthesia Quick Evaluation

## 2021-11-14 NOTE — Op Note (Signed)
Indications: Mr. Bryce Williams is a 61 yo male who underwent T9-L3 posterior fusion in 2022.  He had early evidence of failure of his construct with new kyphosis at T8-9 and evidence of pseudoarthrosis.  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   Preoperative Note:   Risks of surgery discussed include: infection, bleeding, stroke, coma, death, paralysis, CSF leak, nerve/spinal cord injury, numbness, tingling, weakness, complex regional pain syndrome, recurrent stenosis and/or disc herniation, vascular injury, development of instability, neck/back pain, need for further surgery, persistent symptoms, development of deformity, and the risks of anesthesia. The patient understood these risks and agreed to proceed.  Operative Note:  1. Removal of T9 screws 2. Posterolateral arthrodesis T5 to T12 3. Posterior segmental instrumentation T5 to L3 4. Use of stereotaxis    The patient was brought to the Operating Room, intubated and turned into the prone position. All pressure points were checked and double checked.  The patient was prepped and draped in the standard fashion. A full timeout was performed. Preoperative antibiotics were given. The incision was injected with local anesthetic.  The incision was opened with a scalpel, then the soft tissues divided with the Bovie. Self-retaining retractors were placed. The  paraspinus muscles were reflected laterally in subperiosteal fashion until the transverse processes were visible. Counting the prior implants allowed for appropriate exposure.  The locking caps were removed from T9-T11.  The rod was removed and the T9 screws removed bilaterally.  They were noted to be loose with pseudoarthrosis at T9-10.  Stereotactic array was placed on the spinous process of T8-9.  The stereotactic images were acquired and registered to the patient.  Using a stereotactic drill guide, the pedicles were cannulated from T5-T8 bilaterally.  The ball-tipped probe was used to check all tracks.  6.5 x 40 mm screws were placed at each level after tapping and confirming no breach.  After placing implants, the stereotactic fluoroscope was brought in to allow for acquisition of CT images.  This confirmed safe screw placement of all 8 new screws.  It also confirmed adequate alignment.  At this point, rods were placed and secured to the prior construct.  The operative site was fully irrigated.  The posterior elements were decorticated from T5-T12.  Infuse was placed along the decorticated edges.  Bone graft was also placed for arthrodesis from T5-T12.  A drain was placed deep to the fascia.  After hemostasis, the wound was closed in layers with 0 and 2-0 vicryl. 3-0 monocryl and an incisional wound VAC were applied to the incision.  The patient was then flipped supine and extubated with incident. All counts were correct times 2 at the end of the case. No immediate complications were noted.  Bryce Charity PA assisted in the entire procedure.  Bryce Night MD

## 2021-11-15 LAB — CBC
HCT: 33.4 % — ABNORMAL LOW (ref 39.0–52.0)
Hemoglobin: 11.1 g/dL — ABNORMAL LOW (ref 13.0–17.0)
MCH: 22.6 pg — ABNORMAL LOW (ref 26.0–34.0)
MCHC: 33.2 g/dL (ref 30.0–36.0)
MCV: 68 fL — ABNORMAL LOW (ref 80.0–100.0)
Platelets: 182 10*3/uL (ref 150–400)
RBC: 4.91 MIL/uL (ref 4.22–5.81)
RDW: 14.9 % (ref 11.5–15.5)
WBC: 13.3 10*3/uL — ABNORMAL HIGH (ref 4.0–10.5)
nRBC: 0 % (ref 0.0–0.2)

## 2021-11-15 LAB — BASIC METABOLIC PANEL
Anion gap: 9 (ref 5–15)
BUN: 12 mg/dL (ref 8–23)
CO2: 27 mmol/L (ref 22–32)
Calcium: 9 mg/dL (ref 8.9–10.3)
Chloride: 105 mmol/L (ref 98–111)
Creatinine, Ser: 0.92 mg/dL (ref 0.61–1.24)
GFR, Estimated: >60 mL/min (ref >60)
Glucose, Bld: 146 mg/dL — ABNORMAL HIGH (ref 70–99)
Potassium: 3.5 mmol/L (ref 3.5–5.1)
Sodium: 141 mmol/L (ref 135–145)

## 2021-11-15 MED ORDER — CHLORHEXIDINE GLUCONATE CLOTH 2 % EX PADS
6.0000 | MEDICATED_PAD | Freq: Every day | CUTANEOUS | Status: DC
  Administered 2021-11-16 – 2021-11-19 (×4): 6 via TOPICAL

## 2021-11-15 MED ORDER — CELECOXIB 200 MG PO CAPS
200.0000 mg | ORAL_CAPSULE | Freq: Two times a day (BID) | ORAL | Status: DC
  Administered 2021-11-15 – 2021-11-18 (×8): 200 mg via ORAL
  Filled 2021-11-15 (×8): qty 1

## 2021-11-15 MED ORDER — MUPIROCIN 2 % EX OINT
1.0000 | TOPICAL_OINTMENT | Freq: Two times a day (BID) | CUTANEOUS | Status: DC
Start: 2021-11-15 — End: 2021-11-19
  Administered 2021-11-15 – 2021-11-18 (×7): 1 via NASAL
  Filled 2021-11-15: qty 22

## 2021-11-15 NOTE — Progress Notes (Signed)
Met with the patient and his wife at the bedside He has a RW and cane at home Wife provides transportation He would like to have [facility] PT for a couple of weeks and does not want [facility] service his area and accepted the patient No additional needs

## 2021-11-15 NOTE — Progress Notes (Signed)
° °   Attending Progress Note  History: Bryce Williams is s/p removal of T9 screws, posterolateral arthrodesis T5-12.  Posterior instrumentation T5-L3.   POD1: NAEO. Pt reports back pain   Physical Exam: Vitals:   11/15/21 0425 11/15/21 0818  BP: (!) 131/91 122/86  Pulse: 97 93  Resp: 12 18  Temp: 97.7 F (36.5 C) 97.8 F (36.6 C)  SpO2: 99% 98%    AA Ox3 CNI  Strength:5/5 throughout BLE HV#1 300 mL  Hv#2 100 mL  Data:  Recent Labs  Lab 11/15/21 0616  NA 141  K 3.5  CL 105  CO2 27  BUN 12  CREATININE 0.92  GLUCOSE 146*  CALCIUM 9.0   No results for input(s): AST, ALT, ALKPHOS in the last 168 hours.  Invalid input(s): TBILI   Recent Labs  Lab 11/15/21 0616  WBC 13.3*  HGB 11.1*  HCT 33.4*  PLT 182   Recent Labs  Lab 11/14/21 1151  APTT 33  INR 1.1         Other tests/results: labs as above.   Assessment/Plan:  Bryce Williams is a 61 y.o s/p T5-L3 PSF for PJK. He reports expected back pain postoperatively.    - mobilize - pain control - DVT prophylaxis - Continue to monitor drain output - PTOT  Cooper Render PA-C Department of Neurosurgery

## 2021-11-15 NOTE — Evaluation (Signed)
Occupational Therapy Evaluation Patient Details Name: Bryce Williams MRN: [id number] DOB: 01/04/1961 Today's Date: 11/15/2021   History of Present Illness 61yo male s/p removal of T9 screws, posterolateral arthrodesis T5-12, and posterior instrumentation T5-L3 on 11/14/21. PMHx includes T9-L3 posterior fusion 12/22, Agib, acquired scoliosis, anxiety, bipolar disorder, chronic pain syndrome, DDD, depression, HLD, HTN, seizures, stroke, and sleep apnea.   Clinical Impression   Pt seen for OT evaluation this date, POD#1. Prior to hospital admission, pt was independent with household mobility, using a SPC for community distances, and indep with basic ADL and some driving. Spouse does majority of cooking and cleaning. Pt lives with spouse in a single family home with 6 steps and R/L handrails with spouse able to provide 24/7 assist/support as needed for pt. Currently pt requires CGA for ADL mobility, PRN VC For RW mgt/hand placement, MIN A for LB ADL tasks, and MAX A for compression stocking mgt. Pt/spouse educated in back precautions, back brace use, falls prevention, home/routines modifications, pet care considerations, compression stocking mgt strategies, and AE/DME for ADL to maximize safety and adherence to precautions. Handout provided to support recall and carryover. Pt sat EOB unsupported for ~24min to eat breakfast during instruction, got from EOB to the recliner, but was ultimately unable to tolerate sitting up, requesting return to bed despite encouragement 2/2 pain. RN notified. Pt/spouse verbalized understanding of all education/training provided. Pt will benefit from skilled OT services while hospitalized to maximize return to PLOF and maximize adherence to precautions during ADL prior to discharge.    Recommendations for follow up therapy are one component of a multi-disciplinary discharge planning process, led by the attending physician.  Recommendations may be updated based on patient status,  additional functional criteria and insurance authorization.   Follow Up Recommendations  No OT follow up    Assistance Recommended at Discharge PRN  Patient can return home with the following A little help with bathing/dressing/bathroom;Assistance with cooking/housework;Assist for transportation;Help with stairs or ramp for entrance    Functional Status Assessment  Patient has had a recent decline in their functional status and demonstrates the ability to make significant improvements in function in a reasonable and predictable amount of time.  Equipment Recommendations  BSC/3in1;Tub/shower seat;Other (comment) (handheld shower head)    Recommendations for Other Services       Precautions / Restrictions Precautions Precautions: Back;Fall Precaution Booklet Issued: Yes (comment) Restrictions Weight Bearing Restrictions: No      Mobility Bed Mobility Overal bed mobility: Needs Assistance Bed Mobility: Rolling, Sidelying to Sit, Sit to Sidelying Rolling: Min guard Sidelying to sit: Min guard     Sit to sidelying: Min guard General bed mobility comments: SBA after initial instruction in log roll technique    Transfers Overall transfer level: Needs assistance Equipment used: Rolling walker (2 wheels) Transfers: Sit to/from Stand Sit to Stand: Min guard           General transfer comment: VC for hand placement/RW mgt      Balance Overall balance assessment: Needs assistance Sitting-balance support: No upper extremity supported, Feet supported Sitting balance-Leahy Scale: Good     Standing balance support: Bilateral upper extremity supported Standing balance-Leahy Scale: Fair                             ADL either performed or assessed with clinical judgement   ADL  General ADL Comments: Pt requires MIN A for LB ADL tasks 2/2 back precautions and back pain. CGA for ADL transfers wiht VC for  RW mgt/hand placement. PRN VC during session to be mindful of precautions. MAX A for compression stocking mgt. Spouse able to provide needed level of assist.     Vision         Perception     Praxis      Pertinent Vitals/Pain Pain Assessment Pain Assessment: 0-10 Pain Score: 8  Pain Location: middle back Pain Descriptors / Indicators: Aching Pain Intervention(s): Limited activity within patient's tolerance, Monitored during session, Premedicated before session, Repositioned, Patient requesting pain meds-RN notified, RN gave pain meds during session     Hand Dominance     Extremity/Trunk Assessment Upper Extremity Assessment Upper Extremity Assessment: Overall WFL for tasks assessed   Lower Extremity Assessment Lower Extremity Assessment: Overall WFL for tasks assessed   Cervical / Trunk Assessment Cervical / Trunk Assessment: Back Surgery   Communication Communication Communication: No difficulties   Cognition Arousal/Alertness: Awake/alert Behavior During Therapy: WFL for tasks assessed/performed Overall Cognitive Status: Within Functional Limits for tasks assessed                                       General Comments  SpO2 on room air 93-95%, left on room air, RN notified    Exercises Other Exercises Other Exercises: Pt/spouse educated in back precautions, back brace use, falls prevention, home/routines modifications, pet care considerations, compression stocking mgt strategies, and AE/DME for ADL to maximize safety and adherence to precautions. Handout provided to support recall and carryover. Other Exercises: Pt sat EOB unsupported for ~70min to eat breakfast, got from EOB to the recliner, but was ultimately unable to tolerate sitting up, requesting return to bed despite encouragement 2/2 pain. RN notified.   Shoulder Instructions      Home Living Family/patient expects to be discharged to:: Private residence Living Arrangements:  Spouse/significant other Available Help at Discharge: Family;Available 24 hours/day Type of Home: House Home Access: Stairs to enter [employer] of Steps: 6 Entrance Stairs-Rails: Left;Right Home Layout: One level     Bathroom Shower/Tub: Walk-in shower (small stall shower)   Bathroom Toilet: Handicapped height     Home Equipment: Conservation officer, nature (2 wheels);Cane - single point;Grab bars - tub/shower;Adaptive equipment Adaptive Equipment: Reacher;Sock aid        Prior Functioning/Environment Prior Level of Function : Driving;Independent/Modified Independent             Mobility Comments: no AD for household distances, SPC for community distances, denies falls ADLs Comments: indep with basic ADL, med mgt, driving, and spouse does majority of cooking and cleaning        OT Problem List: Decreased range of motion;Pain;Impaired balance (sitting and/or standing);Decreased knowledge of use of DME or AE;Decreased knowledge of precautions      OT Treatment/Interventions: Self-care/ADL training;Therapeutic activities;Therapeutic exercise;DME and/or AE instruction;Patient/family education;Balance training    OT Goals(Current goals can be found in the care plan section) Acute Rehab OT Goals Patient Stated Goal: to go home and be more independent OT Goal Formulation: With patient/family Time For Goal Achievement: 11/29/21 Potential to Achieve Goals: Good ADL Goals Pt Will Perform Lower Body Dressing: with modified independence;with caregiver independent in assisting;with adaptive equipment;sit to/from stand Pt Will Transfer to Toilet: with modified independence;ambulating (elevated commode, LRAD, maintaining back precautions) Pt Will Perform Toileting -  Clothing Manipulation and hygiene: with modified independence Additional ADL Goal #1: Pt will verbalize 100% of back precautions and how to maintain during ADL and transfers. Additional ADL Goal #2: Pt will independently  instruct family/caregiver in compression stocking mgt.  OT Frequency: Min 2X/week    Co-evaluation              AM-PAC OT "6 Clicks" Daily Activity     Outcome Measure Help from another person eating meals?: None Help from another person taking care of personal grooming?: None Help from another person toileting, which includes using toliet, bedpan, or urinal?: A Little Help from another person bathing (including washing, rinsing, drying)?: A Little Help from another person to put on and taking off regular upper body clothing?: None Help from another person to put on and taking off regular lower body clothing?: A Little 6 Click Score: 21   End of Session Equipment Utilized During Treatment: Rolling walker (2 wheels) Nurse Communication: Mobility status;Patient requests pain meds;Other (comment) (left on room air)  Activity Tolerance: Patient tolerated treatment well;Patient limited by pain Patient left: in bed;with call bell/phone within reach;with bed alarm set;with family/visitor present  OT Visit Diagnosis: Other abnormalities of gait and mobility (R26.89);Pain Pain - Right/Left:  (middle back)                Time: [id number] OT Time Calculation (min): 60 min Charges:  OT General Charges $OT Visit: 1 Visit OT Evaluation $OT Eval Moderate Complexity: 1 Mod OT Treatments $Self Care/Home Management : 38-52 mins  Ardeth Perfect., MPH, MS, OTR/L ascom [phone] 11/15/21, 9:34 AM

## 2021-11-15 NOTE — Evaluation (Signed)
Physical Therapy Evaluation Patient Details Name: Bryce Williams MRN: [id number] DOB: [birth year] Today's Date: 11/15/2021  History of Present Illness  61yo male s/p removal of T9 screws, posterolateral arthrodesis T5-12, and posterior instrumentation T5-L3 on 11/14/21. PMHx includes T9-L3 posterior fusion 12/22, Agib, acquired scoliosis, anxiety, bipolar disorder, chronic pain syndrome, DDD, depression, HLD, HTN, seizures, stroke, and sleep apnea.  Clinical Impression  Pt did well with mobility and with some minimal cuing showed increased awareness with appropriate donning and positioning of the TLSO.  He was able to easily ambulate around the nurses' station with light UE/AD use and was able to negotiate up/down steps w/o issue.  Pt has had prior surgeries and is aware of precautions and positioning but did need light reminders t/o the session.  Pt with functional strength in b/l LEs, but L LE did appear to fatigue with increased reps.       Recommendations for follow up therapy are one component of a multi-disciplinary discharge planning process, led by the attending physician.  Recommendations may be updated based on patient status, additional functional criteria and insurance authorization.  Follow Up Recommendations Follow physician's recommendations for discharge plan and follow up therapies    Assistance Recommended at Discharge Intermittent Supervision/Assistance  Patient can return home with the following  Assist for transportation;Assistance with cooking/housework;A little help with bathing/dressing/bathroom    Equipment Recommendations None recommended by PT (maybe BSC?)  Recommendations for Other Services       Functional Status Assessment Patient has had a recent decline in their functional status and demonstrates the ability to make significant improvements in function in a reasonable and predictable amount of time.     Precautions / Restrictions Precautions Precautions:  Back;Fall Precaution Booklet Issued: Yes (comment) Required Braces or Orthoses:  (TLSO) Restrictions Weight Bearing Restrictions: No      Mobility  Bed Mobility Overal bed mobility: Modified Independent Bed Mobility: Rolling, Sidelying to Sit, Sit to Sidelying Rolling: Supervision Sidelying to sit: Supervision       General bed mobility comments: Pt was able to easily get himself to sitting EOB with good maintainance of neutral spine    Transfers Overall transfer level: Modified independent Equipment used: Rolling walker (2 wheels) Transfers: Sit to/from Stand Sit to Stand: Min guard           General transfer comment: Pt was able to rise to standing on multiple occasions with minimal cuing for hand placement and sequencing    Ambulation/Gait Ambulation/Gait assistance: Supervision Gait Distance (Feet): 250 Feet Assistive device: Rolling walker (2 wheels)         General Gait Details: Pt did well with ambulation and showed minimal reliance on UEs/walker and displayed good safety and confidence.  Pt reports pain actually decreased with increased ambulation distance.  Stairs Stairs: Yes Stairs assistance: Supervision Stair Management: Two rails Number of Stairs: 4 General stair comments: Pt was able to easily manage up/down steps w/o issue, minimal cuing  Wheelchair Mobility    Modified Rankin (Stroke Patients Only)       Balance Overall balance assessment: Needs assistance Sitting-balance support: No upper extremity supported, Feet supported Sitting balance-Leahy Scale: Good     Standing balance support: Bilateral upper extremity supported Standing balance-Leahy Scale: Good                               Pertinent Vitals/Pain Pain Assessment Pain Assessment: 0-10 Pain Score: 6  Pain Location:  middle back    Home Living Family/patient expects to be discharged to:: Private residence Living Arrangements: Spouse/significant  other Available Help at Discharge: Family;Available 24 hours/day Type of Home: House Home Access: Stairs to enter Entrance Stairs-Rails: Lawyer of Steps: 6   Home Layout: One level Home Equipment: Agricultural consultant (2 wheels);Cane - single point;Grab bars - tub/shower;Adaptive equipment      Prior Function Prior Level of Function : Driving;Independent/Modified Independent             Mobility Comments: no AD for household distances, SPC for community distances, denies falls - reports he has been limited with walking more than 50-100 ft recently) ADLs Comments: indep with basic ADL, med mgt, driving, and spouse does majority of cooking and cleaning     Hand Dominance        Extremity/Trunk Assessment   Upper Extremity Assessment Upper Extremity Assessment: Overall WFL for tasks assessed    Lower Extremity Assessment Lower Extremity Assessment: Overall WFL for tasks assessed (L LE fatigued quicker than R)    Cervical / Trunk Assessment Cervical / Trunk Assessment: Back Surgery  Communication   Communication: No difficulties  Cognition Arousal/Alertness: Awake/alert Behavior During Therapy: WFL for tasks assessed/performed Overall Cognitive Status: Within Functional Limits for tasks assessed                                          General Comments General comments (skin integrity, edema, etc.): educated on self donning of TLSO    Exercises General Exercises - Lower Extremity Ankle Circles/Pumps: AROM, 10 reps Heel Slides: 10 reps, Strengthening (with lightly resisted leg ext, L fatigues with increased reps) Hip ABduction/ADduction: Strengthening, 10 reps Straight Leg Raises: AROM, 10 reps   Assessment/Plan    PT Assessment Patient needs continued PT services  PT Problem List Decreased strength;Decreased range of motion;Decreased activity tolerance;Decreased balance;Decreased mobility;Decreased safety awareness;Decreased  knowledge of use of DME;Decreased knowledge of precautions;Pain       PT Treatment Interventions DME instruction;Gait training;Stair training;Functional mobility training;Therapeutic activities;Therapeutic exercise;Balance training;Neuromuscular re-education;Patient/family education    PT Goals (Current goals can be found in the Care Plan section)  Acute Rehab PT Goals Patient Stated Goal: Go home PT Goal Formulation: With patient Time For Goal Achievement: 11/29/21 Potential to Achieve Goals: Fair    Frequency 7X/week     Co-evaluation               AM-PAC PT "6 Clicks" Mobility  Outcome Measure Help needed turning from your back to your side while in a flat bed without using bedrails?: A Little Help needed moving from lying on your back to sitting on the side of a flat bed without using bedrails?: A Little Help needed moving to and from a bed to a chair (including a wheelchair)?: A Little Help needed standing up from a chair using your arms (e.g., wheelchair or bedside chair)?: A Little Help needed to walk in hospital room?: A Little Help needed climbing 3-5 steps with a railing? : A Lot 6 Click Score: 17    End of Session Equipment Utilized During Treatment: Gait belt Activity Tolerance: Patient tolerated treatment well Patient left: with chair alarm set;with call bell/phone within reach;with family/visitor present Nurse Communication: Mobility status PT Visit Diagnosis: Muscle weakness (generalized) (M62.81);Difficulty in walking, not elsewhere classified (R26.2)    Time: 8022-3361 PT Time Calculation (min) (ACUTE ONLY):  42 min   Charges:   PT Evaluation $PT Eval Low Complexity: 1 Low PT Treatments $Gait Training: 8-22 mins $Therapeutic Exercise: 8-22 mins        Malachi Pro, DPT 11/15/2021, 1:19 PM

## 2021-11-15 NOTE — Plan of Care (Signed)

## 2021-11-16 MED ORDER — OXYCODONE HCL 5 MG PO TABS
20.0000 mg | ORAL_TABLET | ORAL | Status: DC | PRN
  Administered 2021-11-16 – 2021-11-19 (×6): 20 mg via ORAL
  Filled 2021-11-16 (×6): qty 4

## 2021-11-16 MED ORDER — ACETAMINOPHEN 500 MG PO TABS
1000.0000 mg | ORAL_TABLET | Freq: Four times a day (QID) | ORAL | Status: DC
  Administered 2021-11-16 – 2021-11-19 (×10): 1000 mg via ORAL
  Filled 2021-11-16 (×10): qty 2

## 2021-11-16 MED ORDER — OXYCODONE HCL 5 MG PO TABS
15.0000 mg | ORAL_TABLET | ORAL | Status: DC | PRN
  Administered 2021-11-16 – 2021-11-18 (×10): 15 mg via ORAL
  Filled 2021-11-16 (×10): qty 3

## 2021-11-16 NOTE — Progress Notes (Signed)
Occupational Therapy Treatment ?Patient Details ?Name: Bryce Williams ?MRN: [id number] ?DOB: [birth year] ?Today's Date: 11/16/2021 ? ? ?History of present illness 61yo male s/p removal of T9 screws, posterolateral arthrodesis T5-12, and posterior instrumentation T5-L3 on 11/14/21. PMHx includes T9-L3 posterior fusion 12/22, Agib, acquired scoliosis, anxiety, bipolar disorder, chronic pain syndrome, DDD, depression, HLD, HTN, seizures, stroke, and sleep apnea. ?  ?OT comments ? Bryce Williams was seen for OT follow up regarding safety and functional independence with ADL management in setting of recent thoracic surgery and back precautions. Pt/spouse provided with reinforcement of prior education on back precautions, back brace use, falls prevention, home/routines modifications, log roll technique for bed mobility, compression stocking mgt strategies, and AE/DME for ADL to maximize safety and adherence to precautions. OT facilitated LB dressing tasks with AE, toilet transfer, and ADL management as described below. Pt requires SUPERVISION for safety during ADL management and functional mobility. He return demonstrates excellent understanding of education provided. He is making excellent progress toward OT goals, and continues to benefit from skilled OT services. Will continue to follow POC as written. DC recommendation remains appropriate.   ? ?Recommendations for follow up therapy are one component of a multi-disciplinary discharge planning process, led by the attending physician.  Recommendations may be updated based on patient status, additional functional criteria and insurance authorization. ?   ?Follow Up Recommendations ? No OT follow up  ?  ?Assistance Recommended at Discharge PRN  ?Patient can return home with the following ? A little help with bathing/dressing/bathroom;Assistance with cooking/housework;Assist for transportation;Help with stairs or ramp for entrance ?  ?Equipment Recommendations ? BSC/3in1;Tub/shower  seat;Other (comment)  ?  ?Recommendations for Other Services   ? ?  ?Precautions / Restrictions Precautions ?Precautions: Back;Fall ?Precaution Booklet Issued: Yes (comment) ?Restrictions ?Weight Bearing Restrictions: No  ? ? ?  ? ?Mobility Bed Mobility ?Overal bed mobility: Modified Independent ?Bed Mobility: Rolling, Sidelying to Sit, Sit to Sidelying ?Rolling: Supervision ?Sidelying to sit: Supervision ?  ?  ?Sit to sidelying: Supervision ?General bed mobility comments: Moderate multimodal cueing for use of logroll technique. ?  ? ?Transfers ?Overall transfer level: Needs assistance ?Equipment used: Rolling walker (2 wheels) ?Transfers: Sit to/from Stand ?Sit to Stand: Supervision ?  ?  ?  ?  ?  ?  ?  ?  ?Balance Overall balance assessment: Needs assistance ?Sitting-balance support: No upper extremity supported, Feet supported ?Sitting balance-Leahy Scale: Good ?  ?  ?Standing balance support: Bilateral upper extremity supported, During functional activity, Single extremity supported ?Standing balance-Leahy Scale: Good ?  ?  ?  ?  ?  ?  ?  ?  ?  ?  ?  ?  ?   ? ?ADL either performed or assessed with clinical judgement  ? ?ADL   ?  ?  ?  ?  ?  ?  ?  ?  ?  ?  ?  ?  ?  ?  ?  ?  ?  ?  ?  ?General ADL Comments: Pt requires SUPERVISION LB ADL management after education on safe use of AE/DME for ADL management in context of back precautions. Able to doff/don bilateral hospital socks with good recall and return demonstration of use of LH reacher and sock aid. SUPERVISION for bed/functional mobility with moderate multimodal cueing for use of log-roll technique. ?  ? ?Extremity/Trunk Assessment Upper Extremity Assessment ?Upper Extremity Assessment: Overall WFL for tasks assessed ?  ?Lower Extremity Assessment ?Lower Extremity Assessment: Overall WFL for tasks assessed ?  ?  Cervical / Trunk Assessment ?Cervical / Trunk Assessment: Back Surgery ?  ? ?Vision Patient Visual Report: No change from baseline ?  ?  ?Perception   ?   ?Praxis   ?  ? ?Cognition Arousal/Alertness: Awake/alert ?Behavior During Therapy: [facility] for tasks assessed/performed ?Overall Cognitive Status: Within Functional Limits for tasks assessed ?  ?  ?  ?  ?  ?  ?  ?  ?  ?  ?  ?  ?  ?  ?  ?  ?  ?  ?  ?   ?Exercises Other Exercises ?Other Exercises: Pt/spouse provided with reinforcement of prior education on back precautions, back brace use, falls prevention, home/routines modifications, pet care considerations, compression stocking mgt strategies, and AE/DME for ADL to maximize safety and adherence to precautions. OT facilitated LB dressing task, toilet transfer, and ADL management as described above. ? ?  ?Shoulder Instructions   ? ? ?  ?General Comments    ? ? ?Pertinent Vitals/ Pain       Pain Assessment ?Pain Assessment: 0-10 ?Pain Score: 6  ?Pain Location: middle back ?Pain Descriptors / Indicators: Aching, Grimacing, Constant ?Pain Intervention(s): Limited activity within patient's tolerance, Monitored during session, Repositioned ? ?Home Living   ?  ?  ?  ?  ?  ?  ?  ?  ?  ?  ?  ?  ?  ?  ?  ?  ?  ?  ? ?  ?Prior Functioning/Environment    ?  ?  ?  ?   ? ?Frequency ? Min 2X/week  ? ? ? ? ?  ?Progress Toward Goals ? ?OT Goals(current goals can now be found in the care plan section) ? Progress towards OT goals: Progressing toward goals ? ?Acute Rehab OT Goals ?Patient Stated Goal: To go home and be more independent ?OT Goal Formulation: With patient/family ?Time For Goal Achievement: 11/29/21 ?Potential to Achieve Goals: Good  ?Plan Discharge plan remains appropriate;Frequency remains appropriate   ? ?Co-evaluation ? ? ?   ?  ?  ?  ?  ? ?  ?AM-PAC OT "6 Clicks" Daily Activity     ?Outcome Measure ? ? Help from another person eating meals?: None ?Help from another person taking care of personal grooming?: None ?  ?Help from another person bathing (including washing, rinsing, drying)?: A Little ?Help from another person to put on and taking off regular upper body  clothing?: None ?Help from another person to put on and taking off regular lower body clothing?: A Little ?6 Click Score: 18 ? ?  ?End of Session Equipment Utilized During Treatment: Rolling walker (2 wheels) ? ?OT Visit Diagnosis: Other abnormalities of gait and mobility (R26.89);Pain ?  ?Activity Tolerance Patient tolerated treatment well ?  ?Patient Left in bed;with call bell/phone within reach;with bed alarm set;with family/visitor present ?  ?Nurse Communication   ?  ? ?   ? ?Time: 0250-0324 ?OT Time Calculation (min): 34 min ? ?Charges: OT General Charges ?$OT Visit: 1 Visit ?OT Treatments ?$Self Care/Home Management : 8-22 mins ?$Therapeutic Activity: 8-22 mins ? ?Rockney Ghee, M.S., OTR/L ?Feeding Team - [facility] ?Ascom: [phone] ?11/16/21, 4:22 PM ? ?

## 2021-11-16 NOTE — Progress Notes (Signed)
? ?   Attending Progress Note ? ?History: MCNEIL VALLO is s/p removal of T9 screws, posterolateral arthrodesis T5-12.  Posterior instrumentation T5-L3.  ? ?POD2: continued pain overnight.  ? ?POD1: NAEO. Pt reports back pain  ? ?Physical Exam: ?Vitals:  ? 11/15/21 2139 11/16/21 0454  ?BP: 135/82 131/80  ?Pulse: 92 100  ?Resp: 18 20  ?Temp: 98 ?F (36.7 ?C) 98.3 ?F (36.8 ?C)  ?SpO2: 95% 90%  ? ? ?AA Ox3 ?CNI ? ?Strength:5/5 throughout BLE ?HV#1 100 mL ? Hv#2 0 mL ? ?Data: ? ?Recent Labs  ?Lab 11/15/21 ?0616  ?NA 141  ?K 3.5  ?CL 105  ?CO2 27  ?BUN 12  ?CREATININE 0.92  ?GLUCOSE 146*  ?CALCIUM 9.0  ? ? ?No results for input(s): AST, ALT, ALKPHOS in the last 168 hours. ? ?Invalid input(s): TBILI  ? Recent Labs  ?Lab 11/15/21 ?0616  ?WBC 13.3*  ?HGB 11.1*  ?HCT 33.4*  ?PLT 182  ? ? ?Recent Labs  ?Lab 11/14/21 ?1151  ?APTT 33  ?INR 1.1  ? ?  ?   ? ? ?Other tests/results: labs as above.  ? ?Assessment/Plan: ? ?BAYLAN RETTERER is a 61 y.o s/p T5-L3 PSF for PJK. He reports expected back pain postoperatively.   ? ?- mobilize ?- pain control ?- DVT prophylaxis ?- Continue to monitor drain output ?- PTOT ? ?Cooper Render PA-C ?Department of Neurosurgery ? ?  ?

## 2021-11-16 NOTE — Progress Notes (Signed)
Physical Therapy Treatment ?Patient Details ?Name: Bryce Williams ?MRN: [id number] ?DOB: June 01, 1961 ?Today's Date: 11/16/2021 ? ? ?History of Present Illness 61yo male s/p removal of T9 screws, posterolateral arthrodesis T5-12, and posterior instrumentation T5-L3 on 11/14/21. PMHx includes T9-L3 posterior fusion 12/22, Agib, acquired scoliosis, anxiety, bipolar disorder, chronic pain syndrome, DDD, depression, HLD, HTN, seizures, stroke, and sleep apnea. ? ?  ?PT Comments  ? ? Pt moving extremely well with confident and consistent cadence over ~700 ft of ambulation with and w/o AD.  He did need slight reminders for appropriate posturing for neutral spine with transition back to supine from sitting EOB.  Pt did well with donning brace and he and wife have much better understanding or appropriate wearing and adjusting.  Pt with stronger and more consistent LE strength  with exercises today.  Pt doing very well, will drop frequency down from QD as he has been able to be up and active and is safe and moving nearly independently, showing improvement from his already very good effort on POD1 yesterday.    ?Recommendations for follow up therapy are one component of a multi-disciplinary discharge planning process, led by the attending physician.  Recommendations may be updated based on patient status, additional functional criteria and insurance authorization. ? ?Follow Up Recommendations ? Follow physician's recommendations for discharge plan and follow up therapies ?  ?  ?Assistance Recommended at Discharge Set up Supervision/Assistance  ?Patient can return home with the following Assist for transportation;Assistance with cooking/housework;A little help with bathing/dressing/bathroom ?  ?Equipment Recommendations ? None recommended by PT  ?  ?Recommendations for Other Services   ? ? ?  ?Precautions / Restrictions Precautions ?Precautions: Back;Fall ?Precaution Booklet Issued: Yes (comment) ?Restrictions ?Weight Bearing  Restrictions: No  ?  ? ?Mobility ? Bed Mobility ?Overal bed mobility: Modified Independent ?Bed Mobility: Rolling, Sidelying to Sit, Sit to Sidelying ?Rolling: Supervision ?Sidelying to sit: Supervision ?  ?  ?Sit to sidelying: Supervision ?General bed mobility comments: good protection of neutral spine to sitting, increased cuing needed for return to supine ?  ? ?Transfers ?Overall transfer level: Modified independent ?Equipment used: Rolling walker (2 wheels) ?Transfers: Sit to/from Stand ?Sit to Stand: Supervision ?  ?  ?  ?  ?  ?  ?  ? ?Ambulation/Gait ?Ambulation/Gait assistance: Supervision ?Gait Distance (Feet): 700 Feet ?Assistive device: Rolling walker (2 wheels), None ?  ?  ?  ?  ?General Gait Details: Safe and confident ambulation with community appropriate speed and cadence.  HR quickly to 110s, but stable t/o the effort.  ~350 ft with walker and ~350 w/o, no LOBs, pain or change in cadence w/o AD.  Pt moving very well, minimal cuing needed for postural and postional awareness. ? ? ?Stairs ?  ?  ?  ?  ?  ? ? ?Wheelchair Mobility ?  ? ?Modified Rankin (Stroke Patients Only) ?  ? ? ?  ?Balance   ?  ?  ?  ?  ?  ?  ?  ?  ?  ?  ?  ?  ?  ?  ?  ?  ?  ?  ?  ? ?  ?Cognition Arousal/Alertness: Awake/alert ?Behavior During Therapy: [facility] for tasks assessed/performed ?Overall Cognitive Status: Within Functional Limits for tasks assessed ?  ?  ?  ?  ?  ?  ?  ?  ?  ?  ?  ?  ?  ?  ?  ?  ?  ?  ?  ? ?  ?  Exercises General Exercises - Lower Extremity ?Ankle Circles/Pumps: AROM, 10 reps ?Heel Slides: 10 reps, Strengthening (resisted leg ext, much stronger and more consistent this date) ?Hip ABduction/ADduction: Strengthening, 10 reps ?Straight Leg Raises: AROM, 10 reps ? ?  ?General Comments General comments (skin integrity, edema, etc.): educated on donning/doffing brace with set up assistance only; good execution and understanding ?  ?  ? ?Pertinent Vitals/Pain Pain Assessment ?Pain Score: 4  ?Pain Location: middle back   ? ? ?Home Living   ?  ?  ?  ?  ?  ?  ?  ?  ?  ?   ?  ?Prior Function    ?  ?  ?   ? ?PT Goals (current goals can now be found in the care plan section) Acute Rehab PT Goals ?Patient Stated Goal: Go home ?Progress towards PT goals: Progressing toward goals ? ?  ?Frequency ? ? ? Min 2X/week ? ? ? ?  ?PT Plan Frequency needs to be updated  ? ? ?Co-evaluation   ?  ?  ?  ?  ? ?  ?AM-PAC PT "6 Clicks" Mobility   ?Outcome Measure ? Help needed turning from your back to your side while in a flat bed without using bedrails?: None ?Help needed moving from lying on your back to sitting on the side of a flat bed without using bedrails?: None ?Help needed moving to and from a bed to a chair (including a wheelchair)?: None ?Help needed standing up from a chair using your arms (e.g., wheelchair or bedside chair)?: None ?Help needed to walk in hospital room?: None ?Help needed climbing 3-5 steps with a railing? : None ?6 Click Score: 24 ? ?  ?End of Session Equipment Utilized During Treatment: Back brace ?Activity Tolerance: Patient tolerated treatment well ?Patient left: with bed alarm set;with call bell/phone within reach;with family/visitor present ?Nurse Communication: Mobility status ?PT Visit Diagnosis: Muscle weakness (generalized) (M62.81);Difficulty in walking, not elsewhere classified (R26.2) ?  ? ? ?Time: 1610-9604 ?PT Time Calculation (min) (ACUTE ONLY): 25 min ? ?Charges:  $Gait Training: 8-22 mins ?$Therapeutic Exercise: 8-22 mins          ?          ? ?Malachi Pro, DPT ?11/16/2021, 5:44 PM ? ?

## 2021-11-17 NOTE — Progress Notes (Signed)
? ?   Attending Progress Note ? ?History: Bryce Williams is s/p removal of T9 screws, posterolateral arthrodesis T5-12.  Posterior instrumentation T5-L3.  ? ?POD3: constipated without significant abdominal pain ? ?POD2: continued pain overnight.  ? ?POD1: NAEO. Pt reports back pain  ? ?Physical Exam: ?Vitals:  ? 11/17/21 0000 11/17/21 0414  ?BP: 127/81 (!) 145/83  ?Pulse: 95 84  ?Resp: 20 16  ?Temp: 97.9 ?F (36.6 ?C) 98.4 ?F (36.9 ?C)  ?SpO2: 93% 93%  ? ? ?AA Ox3 ?CNI ? ?Strength:5/5 throughout BLE ?HV#1 120 mL ? Hv#2  15 mL ? ?Data: ? ?Recent Labs  ?Lab 11/15/21 ?0616  ?NA 141  ?K 3.5  ?CL 105  ?CO2 27  ?BUN 12  ?CREATININE 0.92  ?GLUCOSE 146*  ?CALCIUM 9.0  ? ? ?No results for input(s): AST, ALT, ALKPHOS in the last 168 hours. ? ?Invalid input(s): TBILI  ? Recent Labs  ?Lab 11/15/21 ?0616  ?WBC 13.3*  ?HGB 11.1*  ?HCT 33.4*  ?PLT 182  ? ? ?Recent Labs  ?Lab 11/14/21 ?1151  ?APTT 33  ?INR 1.1  ? ?  ?   ? ? ?Other tests/results: labs as above.  ? ?Assessment/Plan: ? ?Bryce Williams is a 61 y.o s/p T5-L3 PSF for PJK. He reports expected back pain postoperatively.   ? ?- mobilize ?- pain control ?- DVT prophylaxis ?- Continue to monitor drain output. Will likely remove deep drain this afternoon ?- Please give PRNs for constipation ?- PTOT ? ?Manning Charity PA-C ?Department of Neurosurgery ? ?  ?

## 2021-11-17 NOTE — Plan of Care (Signed)

## 2021-11-17 NOTE — Progress Notes (Signed)
Occupational Therapy Treatment ?Patient Details ?Name: Bryce Williams ?MRN: [id number] ?DOB: 1961-03-03 ?Today's Date: 11/17/2021 ? ? ?History of present illness 61yo male s/p removal of T9 screws, posterolateral arthrodesis T5-12, and posterior instrumentation T5-L3 on 11/14/21. PMHx includes T9-L3 posterior fusion 12/22, Agib, acquired scoliosis, anxiety, bipolar disorder, chronic pain syndrome, DDD, depression, HLD, HTN, seizures, stroke, and sleep apnea. ?  ?OT comments ? Bryce Williams presents today with generalized weakness, pain, and impaired balance. During today's session, he was able to transfer sit<>stand, ambulate safely w/ and w/o RW, transfer to toilet, engage in dressing and grooming, all without LOB, no physical assistance needed. Provided educ to pt and family re: AE for LB dressing/bathing and how to don/doff back brace. They endorsed understanding and provide teach-back. Pt demonstrates good understanding of precautions and fall prevention strategies. No additional OT sessions required at this point, will sign off.  ? ?Recommendations for follow up therapy are one component of a multi-disciplinary discharge planning process, led by the attending physician.  Recommendations may be updated based on patient status, additional functional criteria and insurance authorization. ?   ?Follow Up Recommendations ? No OT follow up  ?  ?Assistance Recommended at Discharge PRN  ?Patient can return home with the following ? A little help with bathing/dressing/bathroom;Assist for transportation;Help with stairs or ramp for entrance ?  ?Equipment Recommendations ? Tub/shower seat;BSC/3in1  ?  ?Recommendations for Other Services   ? ?  ?Precautions / Restrictions Precautions ?Precautions: Back;Fall ?Required Braces or Orthoses: Spinal Brace ?Spinal Brace: Thoracolumbosacral orthotic ?Restrictions ?Weight Bearing Restrictions: No  ? ? ?  ? ?Mobility Bed Mobility ?  ?  ?  ?  ?  ?  ?  ?General bed mobility comments: pt received,  left in recliner ?  ? ?Transfers ?Overall transfer level: Modified independent ?Equipment used: Rolling walker (2 wheels) ?Transfers: Sit to/from Stand ?Sit to Stand: Modified independent (Device/Increase time) ?  ?  ?  ?  ?  ?General transfer comment: good eccentric, concentric control; fluid movement; no cueing required ?  ?  ?Balance Overall balance assessment: Needs assistance ?Sitting-balance support: No upper extremity supported, Feet supported ?Sitting balance-Leahy Scale: Good ?  ?  ?Standing balance support: Bilateral upper extremity supported, During functional activity, Single extremity supported, No upper extremity supported ?Standing balance-Leahy Scale: Good ?Standing balance comment: Able to reach outside of BOS w/ and w/o RW ?  ?  ?  ?  ?  ?  ?  ?  ?  ?  ?  ?   ? ?ADL either performed or assessed with clinical judgement  ? ?ADL Overall ADL's : Needs assistance/impaired ?Eating/Feeding: Independent ?  ?Grooming: Wash/dry hands;Modified independent ?  ?  ?  ?  ?  ?Upper Body Dressing : Modified independent;Standing ?  ?  ?  ?Toilet Transfer: Statistician ?  ?Toileting- Clothing Manipulation and Hygiene: Modified independent ?  ?  ?  ?  ?  ?  ? ?Extremity/Trunk Assessment Upper Extremity Assessment ?Upper Extremity Assessment: Overall WFL for tasks assessed ?  ?Lower Extremity Assessment ?Lower Extremity Assessment: Overall WFL for tasks assessed ?  ?Cervical / Trunk Assessment ?Cervical / Trunk Assessment: Back Surgery ?  ? ?Vision Patient Visual Report: No change from baseline ?  ?  ?Perception   ?  ?Praxis   ?  ? ?Cognition Arousal/Alertness: Awake/alert ?Behavior During Therapy: [facility] for tasks assessed/performed ?Overall Cognitive Status: Within Functional Limits for tasks assessed ?  ?  ?  ?  ?  ?  ?  ?  ?  ?  ?  ?  ?  ?  ?  ?  ?  ?  ?  ?   ?  Exercises Other Exercises ?Other Exercises: Educ pt and family re: donning/doffing TLSO, AE for LB dressing/bathing, back  precautions. ? ?  ?Shoulder Instructions   ? ? ?  ?General Comments    ? ? ?Pertinent Vitals/ Pain       Pain Assessment ?Pain Assessment: 0-10 ?Pain Score: 2  ?Pain Intervention(s): Limited activity within patient's tolerance, Premedicated before session, Monitored during session ? ?Home Living   ?  ?  ?  ?  ?  ?  ?  ?  ?  ?  ?  ?  ?  ?  ?  ?  ?  ?  ? ?  ?Prior Functioning/Environment    ?  ?  ?  ?   ? ?Frequency ? Min 2X/week  ? ? ? ? ?  ?Progress Toward Goals ? ?OT Goals(current goals can now be found in the care plan section) ? Progress towards OT goals: Progressing toward goals ? ?Acute Rehab OT Goals ?Patient Stated Goal: to get home ?OT Goal Formulation: With patient/family ?Time For Goal Achievement: 11/29/21 ?Potential to Achieve Goals: Good  ?Plan Discharge plan remains appropriate;Frequency remains appropriate   ? ?Co-evaluation ? ? ?   ?  ?  ?  ?  ? ?  ?AM-PAC OT "6 Clicks" Daily Activity     ?Outcome Measure ? ? Help from another person eating meals?: None ?Help from another person taking care of personal grooming?: None ?Help from another person toileting, which includes using toliet, bedpan, or urinal?: A Little ?Help from another person bathing (including washing, rinsing, drying)?: A Little ?Help from another person to put on and taking off regular upper body clothing?: None ?Help from another person to put on and taking off regular lower body clothing?: A Little ?6 Click Score: 21 ? ?  ?End of Session Equipment Utilized During Treatment: Rolling walker (2 wheels) ? ?OT Visit Diagnosis: Other abnormalities of gait and mobility (R26.89);Pain ?  ?Activity Tolerance Patient tolerated treatment well ?  ?Patient Left in chair;with call bell/phone within reach;with family/visitor present ?  ?Nurse Communication   ?  ? ?   ? ?Time: 5726-2035 ?OT Time Calculation (min): 19 min ? ?Charges: OT General Charges ?$OT Visit: 1 Visit ?OT Treatments ?$Self Care/Home Management : 8-22 mins ? ?Latina Craver, PhD,  MS, OTR/L ?11/17/21, 12:50 PM ? ?

## 2021-11-17 NOTE — Progress Notes (Signed)
PT Cancellation Note ? ?Patient Details ?Name: Bryce Williams ?MRN: [id number] ?DOB: 1961/07/27 ? ? ?Cancelled Treatment:     PT attempt. Pt recently received enema. Acute PT will return tomorrow and continue to follow per current POC.  ? ? ?[name] ?11/17/2021, 3:34 PM ?

## 2021-11-17 NOTE — Progress Notes (Signed)
PT Cancellation Note ? ?Patient Details ?Name: Bryce Williams ?MRN: [id number] ?DOB: 03-Aug-1961 ? ? ?Cancelled Treatment:    Reason Eval/Treat Not Completed: Other (comment).  Pt's breakfast had just arrived 1st attempt (pt also requesting pain meds d/t 7/10 back pain--nurse notified) and pt with OT on 2nd therapy attempt (not available for PT session).  Will re-attempt PT session at a later date/time as able. ? ?Hendricks Limes, PT ?11/17/21, 11:18 AM ? ?

## 2021-11-18 MED ORDER — ARIPIPRAZOLE 10 MG PO TABS
20.0000 mg | ORAL_TABLET | Freq: Every day | ORAL | Status: DC
  Administered 2021-11-19: 20 mg via ORAL
  Filled 2021-11-18 (×3): qty 2

## 2021-11-18 MED ORDER — LACTULOSE 10 GM/15ML PO SOLN
10.0000 g | Freq: Two times a day (BID) | ORAL | Status: DC | PRN
  Administered 2021-11-18: 10 g via ORAL
  Filled 2021-11-18: qty 30

## 2021-11-18 MED ORDER — SORBITOL 70 % SOLN
960.0000 mL | TOPICAL_OIL | Freq: Once | ORAL | Status: DC
  Filled 2021-11-18: qty 473

## 2021-11-18 MED ORDER — GLYCERIN (LAXATIVE) 2 G RE SUPP
1.0000 | Freq: Every day | RECTAL | Status: DC | PRN
  Filled 2021-11-18: qty 1

## 2021-11-18 MED ORDER — SORBITOL 70 % SOLN
30.0000 mL | Freq: Once | Status: AC
  Administered 2021-11-18: 30 mL via ORAL
  Filled 2021-11-18: qty 30

## 2021-11-18 NOTE — Progress Notes (Signed)
? ?   Attending Progress Note ? ?History: Bryce Williams is s/p removal of T9 screws, posterolateral arthrodesis T5-12.  Posterior instrumentation T5-L3.  ? ?POD4: Continued constipation without abdominal pain ? ?POD3: constipated without significant abdominal pain ? ?POD2: continued pain overnight.  ? ?POD1: NAEO. Pt reports back pain  ? ?Physical Exam: ?Vitals:  ? 11/17/21 2323 11/18/21 0322  ?BP: (!) 144/90 136/87  ?Pulse: 91 85  ?Resp: 16 16  ?Temp: 97.6 ?F (36.4 ?C) 97.7 ?F (36.5 ?C)  ?SpO2: 92% 92%  ? ? ?AA Ox3 ?CNI ? ?Strength:5/5 throughout BLE ?HV#1 60 mL ? Hv#2  20 mL ? ?Data: ? ?Recent Labs  ?Lab 11/15/21 ?0616  ?NA 141  ?K 3.5  ?CL 105  ?CO2 27  ?BUN 12  ?CREATININE 0.92  ?GLUCOSE 146*  ?CALCIUM 9.0  ? ? ?No results for input(s): AST, ALT, ALKPHOS in the last 168 hours. ? ?Invalid input(s): TBILI  ? Recent Labs  ?Lab 11/15/21 ?0616  ?WBC 13.3*  ?HGB 11.1*  ?HCT 33.4*  ?PLT 182  ? ? ?Recent Labs  ?Lab 11/14/21 ?1151  ?APTT 33  ?INR 1.1  ? ?  ?   ? ? ?Other tests/results: labs as above.  ? ?Assessment/Plan: ? ?Bryce Williams is a 61 y.o s/p T5-L3 PSF for PJK. He reports expected back pain postoperatively.   ? ?- mobilize ?- pain control ?- DVT prophylaxis ?- Please give PRNs for constipation ?- PTOT ? ?Manning Charity PA-C ?Department of Neurosurgery ? ?  ?

## 2021-11-18 NOTE — Care Management Important Message (Signed)
Important Message ? ?Patient Details  ?Name: Bryce Williams ?MRN: [id number] ?Date of Birth: Jun 19, 1961 ? ? ?Medicare Important Message Given:  Yes ? ? ? ? ?Olegario Messier A Dot Splinter ?11/18/2021, 11:36 AM ?

## 2021-11-18 NOTE — Plan of Care (Signed)
?  Problem: Education: ?Goal: Knowledge of General Education information will improve ?Description: Including pain rating scale, medication(s)/side effects and non-pharmacologic comfort measures ?Outcome: Progressing ?  ?Problem: Health Behavior/Discharge Planning: ?Goal: Ability to manage health-related needs will improve ?Outcome: Progressing ?  ?Problem: Clinical Measurements: ?Goal: Diagnostic test results will improve ?Outcome: Progressing ?  ?Problem: Clinical Measurements: ?Goal: Cardiovascular complication will be avoided ?Outcome: Progressing ?  ?Problem: Nutrition: ?Goal: Adequate nutrition will be maintained ?Outcome: Progressing ?  ?Problem: Coping: ?Goal: Level of anxiety will decrease ?Outcome: Progressing ?  ?Problem: Safety: ?Goal: Ability to remain free from injury will improve ?Outcome: Progressing ?  ?

## 2021-11-18 NOTE — Progress Notes (Signed)
Physical Therapy Treatment ?Patient Details ?Name: Bryce Williams ?MRN: [id number] ?DOB: 11-Jan-1961 ?Today's Date: 11/18/2021 ? ? ?History of Present Illness 61 yo male s/p removal of T9 screws, posterolateral arthrodesis T5-12, and posterior instrumentation T5-L3 on 11/14/21. PMHx includes T9-L3 posterior fusion 12/22, Agib, acquired scoliosis, anxiety, bipolar disorder, chronic pain syndrome, DDD, depression, HLD, HTN, seizures, stroke, and sleep apnea. ? ?  ?PT Comments  ? ? Pt sitting on edge of bed upon PT arrival; pt agreeable to PT session; pt's wife present during session.  Per request of pt and pt's wife, reviewed donning/doffing of pt's brace and appropriate fit (performed hands on training with pt and pt's wife): both verbalizing appropriate understanding and reporting no further brace questions/concerns.  Pt declined to use walker during session (requesting to walk without any DME use).  Pt able to walk 3 laps around nursing loop and also navigate 8 steps with railing.  5/10 back pain throughout session (no change in back pain throughout session's activities).  Encouraged pt to ambulate with staff 3x's per day in hallway: pt verbalizing appropriate understanding.  Overall pt mobilizing well.  Will continue to focus on strengthening and progressive functional mobility during hospitalization. ?  ?Recommendations for follow up therapy are one component of a multi-disciplinary discharge planning process, led by the attending physician.  Recommendations may be updated based on patient status, additional functional criteria and insurance authorization. ? ?Follow Up Recommendations ? Follow physician's recommendations for discharge plan and follow up therapies ?  ?  ?Assistance Recommended at Discharge Set up Supervision/Assistance  ?Patient can return home with the following Assist for transportation;Assistance with cooking/housework;A little help with bathing/dressing/bathroom ?  ?Equipment Recommendations ? None  recommended by PT  ?  ?Recommendations for Other Services   ? ? ?  ?Precautions / Restrictions Precautions ?Precautions: Back;Fall ?Precaution Booklet Issued: Yes (comment) ?Required Braces or Orthoses: Spinal Brace ?Spinal Brace: Thoracolumbosacral orthotic;Applied in sitting position ?Restrictions ?Weight Bearing Restrictions: No  ?  ? ?Mobility ? Bed Mobility ?Overal bed mobility: Modified Independent ?Bed Mobility: Rolling, Sidelying to Sit, Sit to Sidelying ?Rolling: Supervision ?Sidelying to sit: Supervision ?  ?  ?Sit to sidelying: Supervision ?General bed mobility comments: bed flat; initial vc's for technique (d/t pt's wife requesting to review bed mobility technique) ?  ? ?Transfers ?Overall transfer level: Modified independent ?Equipment used: None ?Transfers: Sit to/from Stand ?Sit to Stand: Modified independent (Device/Increase time) ?  ?  ?  ?  ?  ?General transfer comment: steady safe transfers noted (UE support on bed for transfers) ?  ? ?Ambulation/Gait ?Ambulation/Gait assistance: Supervision ?Gait Distance (Feet): 580 Feet ?Assistive device: None ?  ?  ?  ?  ?General Gait Details: steady step through gait pattern ? ? ?Stairs ?Stairs: Yes ?Stairs assistance: Supervision ?Stair Management: One rail Left, Step to pattern, Forwards ?Number of Stairs: 8 ?General stair comments: steady and safe stairs navigation ? ? ?Wheelchair Mobility ?  ? ?Modified Rankin (Stroke Patients Only) ?  ? ? ?  ?Balance Overall balance assessment: Needs assistance ?Sitting-balance support: No upper extremity supported, Feet supported ?Sitting balance-Leahy Scale: Good ?Sitting balance - Comments: steady sitting reaching within BOS ?  ?Standing balance support: No upper extremity supported, During functional activity ?Standing balance-Leahy Scale: Good ?Standing balance comment: no loss of balance with functional mobility ?  ?  ?  ?  ?  ?  ?  ?  ?  ?  ?  ?  ? ?  ?Cognition Arousal/Alertness: Awake/alert ?Behavior During  Therapy: WFL for tasks assessed/performed ?Overall Cognitive Status: Within Functional Limits for tasks assessed ?  ?  ?  ?  ?  ?  ?  ?  ?  ?  ?  ?  ?  ?  ?  ?  ?  ?  ?  ? ?  ?Exercises   ? ?  ?General Comments   ?  ?  ? ?Pertinent Vitals/Pain Pain Assessment ?Pain Assessment: 0-10 ?Pain Score: 5  ?Pain Descriptors / Indicators: Aching, Sore, Constant ?Pain Intervention(s): Limited activity within patient's tolerance, Monitored during session, Premedicated before session, Repositioned ?Vitals (HR and O2 on room air) stable and WFL throughout treatment session.  ? ? ?Home Living   ?  ?  ?  ?  ?  ?  ?  ?  ?  ?   ?  ?Prior Function    ?  ?  ?   ? ?PT Goals (current goals can now be found in the care plan section) Acute Rehab PT Goals ?Patient Stated Goal: Go home ?PT Goal Formulation: With patient ?Time For Goal Achievement: 11/29/21 ?Potential to Achieve Goals: Good ?Progress towards PT goals: Progressing toward goals ? ?  ?Frequency ? ? ? Min 2X/week ? ? ? ?  ?PT Plan Current plan remains appropriate  ? ? ?Co-evaluation   ?  ?  ?  ?  ? ?  ?AM-PAC PT "6 Clicks" Mobility   ?Outcome Measure ? Help needed turning from your back to your side while in a flat bed without using bedrails?: None ?Help needed moving from lying on your back to sitting on the side of a flat bed without using bedrails?: A Little ?Help needed moving to and from a bed to a chair (including a wheelchair)?: None ?Help needed standing up from a chair using your arms (e.g., wheelchair or bedside chair)?: None ?Help needed to walk in hospital room?: A Little ?Help needed climbing 3-5 steps with a railing? : A Little ?6 Click Score: 21 ? ?  ?End of Session Equipment Utilized During Treatment: Gait belt;Back brace ?Activity Tolerance: Patient tolerated treatment well ?Patient left: in bed;with call bell/phone within reach;with family/visitor present ?Nurse Communication: Mobility status;Precautions ?PT Visit Diagnosis: Muscle weakness (generalized)  (M62.81);Difficulty in walking, not elsewhere classified (R26.2) ?  ? ? ?Time: 0998-3382 ?PT Time Calculation (min) (ACUTE ONLY): 40 min ? ?Charges:  $Gait Training: 8-22 mins ?$Therapeutic Exercise: 8-22 mins ?$Therapeutic Activity: 8-22 mins          ?          ?Hendricks Limes, PT ?11/18/21, 1:48 PM ? ? ?

## 2021-11-19 MED ORDER — TIZANIDINE HCL 2 MG PO TABS
2.0000 mg | ORAL_TABLET | Freq: Four times a day (QID) | ORAL | 0 refills | Status: AC | PRN
Start: 2021-11-19 — End: 2022-02-17

## 2021-11-19 MED ORDER — OXYCODONE HCL 10 MG PO TABS: 10.0000 mg | ORAL_TABLET | ORAL | 0 refills | Status: AC | PRN

## 2021-11-19 MED ORDER — SENNOSIDES-DOCUSATE SODIUM 8.6-50 MG PO TABS: 1.0000 | ORAL_TABLET | Freq: Two times a day (BID) | ORAL | 0 refills | Status: AC | PRN

## 2021-11-19 NOTE — Discharge Summary (Signed)
Physician Discharge Summary  ?Patient ID: ?KHI MCMILLEN ?MRN: [id number] ?DOB/AGE: 25-Apr-1961 61 y.o. ? ?Admit date: 11/14/2021 ?Discharge date: 11/19/2021 ? ?Admission Diagnoses: Pseudoarthrosis, Thoracic kyphosis, prior spinal fusion ? ?Discharge Diagnoses:  ?Principal Problem: ?  S/P spinal fusion ? ? ?Discharged Condition: good ? ?Hospital Course: Mr. Bryce Williams was admitted for elective surgical correction of a pseudoarthrosis.  He did well with surgery and was admitted to the floor postoperatively.  On postoperative day 5, he had met criteria for discharge. ? ?Consults: None ? ?Significant Diagnostic Studies: radiology: X-Ray: Intraoperative CT scan showed correct placement of implants. ? ?Treatments: surgery: Extension of fusion to T5 ? ?Discharge Exam: ?Blood pressure (!) 146/95, pulse 98, temperature (!) 97.3 ?F (36.3 ?C), resp. rate 18, height _0  (1.727 m), weight 108.4 kg, SpO2 94 %. ?General appearance: alert and cooperative ?Cranial nerves intact.  5 out of 5 strength throughout bilateral lower extremities.  Incision clean dry and intact ? ?Disposition: Discharge disposition: 01-Home or Self Care ? ? ? ? ? ? ?Discharge Instructions   ? ? Discharge patient   Complete by: As directed ?  ? Discharge disposition: 01-Home or Self Care  ? Discharge patient date: 11/19/2021  ? Incentive spirometry RT   Complete by: As directed ?  ? ?  ? ?Allergies as of 11/19/2021   ? ?   Reactions  ? Morphine And Related Rash  ? Prednisone Anxiety  ? ?  ? ?  ?Medication List  ?  ? ?TAKE these medications   ? ?apixaban 5 MG Tabs tablet ?Commonly known as: ELIQUIS ?Take 5 mg by mouth 2 (two) times daily. ?Notes to patient: OK to restart on November 28, 2021 ?  ?ARIPiprazole 20 MG tablet ?Commonly known as: ABILIFY ?Take 20 mg by mouth at bedtime. ?  ?atorvastatin 40 MG tablet ?Commonly known as: LIPITOR ?Take 40 mg by mouth in the morning. ?  ?celecoxib 200 MG capsule ?Commonly known as: CELEBREX ?Take 200 mg by mouth every 12 (twelve)  hours. ?  ?DULoxetine 60 MG capsule ?Commonly known as: CYMBALTA ?Take 60 mg by mouth 2 (two) times daily. ?  ?hydrochlorothiazide 25 MG tablet ?Commonly known as: HYDRODIURIL ?Take 25 mg by mouth in the morning. ?  ?hydrOXYzine 25 MG tablet ?Commonly known as: ATARAX ?Take 25 mg by mouth 3 (three) times daily as needed for anxiety. ?  ?levETIRAcetam 500 MG tablet ?Commonly known as: KEPPRA ?Take 500 mg by mouth 2 (two) times daily. ?  ?Oxycodone HCl 10 MG Tabs ?Take 1-2 tablets (10-20 mg total) by mouth every 4 (four) hours as needed for up to 7 days for moderate pain or severe pain (10 mg if moderate pain, 20 mg if severe). ?What changed:  ?how much to take ?when to take this ?reasons to take this ?  ?senna-docusate 8.6-50 MG tablet ?Commonly known as: Senokot-S ?Take 1-2 tablets by mouth 2 (two) times daily as needed for mild constipation (take if no BM within 24 hours). ?  ?tiZANidine 2 MG tablet ?Commonly known as: ZANAFLEX ?Take 1-2 tablets (2-4 mg total) by mouth every 6 (six) hours as needed for muscle spasms. ?  ?traZODone 100 MG tablet ?Commonly known as: DESYREL ?Take 200 mg by mouth at bedtime. ?  ? ?  ? ? Follow-up Information   ? ? Loleta Dicker, PA Follow up in 2 week(s).   ?Why: already scheduled ?Contact information: ?[place] [address] ?[phone] ? ? ?  ?  ? ?  ?  ? ?  ? ? ?  Signed: ?Gaige Sebo ?11/19/2021, 8:59 AM ? ? ?

## 2021-11-19 NOTE — Discharge Instructions (Signed)
?  Your surgeon has performed an operation on your spine. Many times, patients feel better immediately after surgery and can ?overdo it.? Even if you feel well, it is important that you follow these activity guidelines. If you do not let your back heal properly from the surgery, you can increase the chance of a disc herniation and/or return of your symptoms. The following are instructions to help in your recovery once you have been discharged from the hospital. ? ?* Do not take anti-inflammatory medications for 3 months after surgery (naproxen [Aleve], ibuprofen [Advil, Motrin], etc.)  ? ?Celebrex is ok to take. ? ?Activity  ?  ?No bending, lifting, or twisting (?BLT?). Avoid lifting objects heavier than 10 pounds (gallon milk jug).  Where possible, avoid household activities that involve lifting, bending, pushing, or pulling such as laundry, vacuuming, grocery shopping, and childcare. Try to arrange for help from friends and family for these activities while your back heals. ? ?Increase physical activity slowly as tolerated.  Taking short walks is encouraged, but avoid strenuous exercise. Do not jog, run, bicycle, lift weights, or participate in any other exercises unless specifically allowed by your doctor. Avoid prolonged sitting, including car rides. ? ?Talk to your doctor before resuming sexual activity. ? ?You should not drive until cleared by your doctor. ? ?Until released by your doctor, you should not return to work or school.  You should rest at home and let your body heal.  ? ?You may shower.  After showering, lightly dab your incision dry. Do not take a tub bath or go swimming for 3 weeks, or until approved by your doctor at your follow-up appointment. ? ?If you smoke, we strongly recommend that you quit.  Smoking has been proven to interfere with normal healing in your back and will dramatically reduce the success rate of your surgery. Please contact [facility] (800-QUIT-NOW) and use the resources at  www.[facility].com for assistance in stopping smoking. ? ?Surgical Incision ?  ?OK to remove dressing on Monday 11/21/21. ? ? ?Diet          ? ? You may return to your usual diet. Be sure to stay hydrated. ? ?When to Contact us ? ?Although your surgery and recovery will likely be uneventful, you may have some residual numbness, aches, and pains in your back and/or legs. This is normal and should improve in the next few weeks. ? ?However, should you experience any of the following, contact us immediately: ?New numbness or weakness ?Pain that is progressively getting worse, and is not relieved by your pain medications or rest ?Bleeding, redness, swelling, pain, or drainage from surgical incision ?Chills or flu-like symptoms ?Fever greater than 101.0 F (38.3 C) ?Problems with bowel or bladder functions ?Difficulty breathing or shortness of breath ?Warmth, tenderness, or swelling in your calf ? ?Contact Information ?During office hours (Monday-Friday 9 am to 5 pm), please call your physician at [phone] ?After hours and weekends, please call [phone] and speak with the answering service, who will contact the doctor on call.  If that fails, call the [facility] Operator at [phone] and ask for the Neurosurgery Resident On Call  ?For a life-threatening emergency, call 911  ?

## 2021-11-19 NOTE — Progress Notes (Signed)
? ?   Attending Progress Note ? ?History: KAVAUGHN FAUCETT is s/p removal of T9 screws, posterolateral arthrodesis T5-12.  Posterior instrumentation T5-L3.  ? ?POD5: Doing well.  Had a BM last night. Feeling better. ? ?POD4: Continued constipation without abdominal pain ? ?POD3: constipated without significant abdominal pain ? ?POD2: continued pain overnight.  ? ?POD1: NAEO. Pt reports back pain  ? ?Physical Exam: ?Vitals:  ? 11/18/21 1942 11/19/21 0835  ?BP: 140/87 (!) 146/95  ?Pulse: 89 98  ?Resp: 18 18  ?Temp: 98.7 ?F (37.1 ?C) (!) 97.3 ?F (36.3 ?C)  ?SpO2: 95% 94%  ? ? ?AA Ox3 ?CNI ? ?Strength:5/5 throughout BLE ?Hv#2  35 mL - removed ? ?Data: ? ?Recent Labs  ?Lab 11/15/21 ?0616  ?NA 141  ?K 3.5  ?CL 105  ?CO2 27  ?BUN 12  ?CREATININE 0.92  ?GLUCOSE 146*  ?CALCIUM 9.0  ? ?No results for input(s): AST, ALT, ALKPHOS in the last 168 hours. ? ?Invalid input(s): TBILI  ? Recent Labs  ?Lab 11/15/21 ?0616  ?WBC 13.3*  ?HGB 11.1*  ?HCT 33.4*  ?PLT 182  ? ?Recent Labs  ?Lab 11/14/21 ?1151  ?APTT 33  ?INR 1.1  ?  ?   ? ? ?Other tests/results: labs as above.  ? ?Assessment/Plan: ? ?XAVIEN DAUPHINAIS is a 61 y.o s/p T5-L3 PSF for PJK. He reports expected back pain postoperatively.   ? ?- mobilize ?- pain control ?- DVT prophylaxis ?- PTOT ?- Dressing removed and drain removed. ? ?Venetia Night MD ?Department of Neurosurgery ? ?  ?

## 2021-11-22 ENCOUNTER — Encounter: Payer: Self-pay | Admitting: Neurosurgery

## 2021-11-22 NOTE — Anesthesia Postprocedure Evaluation (Signed)
Anesthesia Post Note ? ?Patient: Bryce Williams ? ?Procedure(s) Performed: T4-L3 POSTERIOR SPINAL FUSION (Spine Lumbar) ?APPLICATION OF INTRAOPERATIVE CT SCAN (Spine Lumbar) ? ?Patient location during evaluation: PACU ?Anesthesia Type: General ?Level of consciousness: awake and alert ?Pain management: pain level controlled ?Vital Signs Assessment: post-procedure vital signs reviewed and stable ?Respiratory status: spontaneous breathing, nonlabored ventilation, respiratory function stable and patient connected to nasal cannula oxygen ?Cardiovascular status: blood pressure returned to baseline and stable ?Postop Assessment: no apparent nausea or vomiting ?Anesthetic complications: no ? ? ?No notable events documented. ? ? ?Last Vitals:  ?Vitals:  ? 11/18/21 1942 11/19/21 0835  ?BP: 140/87 (!) 146/95  ?Pulse: 89 98  ?Resp: 18 18  ?Temp: 37.1 ?C (!) 36.3 ?C  ?SpO2: 95% 94%  ?  ?Last Pain:  ?Vitals:  ? 11/19/21 0817  ?TempSrc:   ?PainSc: 6   ? ? ?  ?  ?  ?  ?  ?  ? ?Yevette Edwards ? ? ? ? ?

## 2021-11-26 ENCOUNTER — Other Ambulatory Visit: Payer: Self-pay | Admitting: Neurosurgery

## 2021-11-26 MED ORDER — OXYCODONE HCL 5 MG PO TABS: 10.0000 mg | ORAL_TABLET | ORAL | Status: DC | PRN

## 2021-11-26 MED ORDER — OXYCODONE HCL 10 MG PO TABS: 10.0000 mg | ORAL_TABLET | ORAL | 0 refills | Status: DC | PRN

## 2021-11-28 ENCOUNTER — Encounter: Payer: Self-pay | Admitting: Neurosurgery

## 2022-03-30 ENCOUNTER — Telehealth: Payer: Self-pay

## 2022-03-30 NOTE — Telephone Encounter (Signed)
This was an electronic refill request that was forwarded to [place] from [place]. The system popped up with a warning that it may not be covered by his insurance plan. I have left the pt a message requesting he call us back to let us know whether or not he needs this refilled.

## 2022-03-30 NOTE — Telephone Encounter (Signed)
-----   Message from Rockey Situ sent at 03/30/2022  1:38 PM EDT ----- Regarding: med refill Contact: [phone] Methocarbamol 500mg  Sig: TAKE 1 OR 2 TABLETS BY MOUTH EVERY SIX HOURS AS NEEDED FOR MUSCLE SPASMS Disp: 120 tablet    Refills: 1 Start: 03/30/2022 Class: Normal Non-formulary Last ordered: 5 months ago by 04/01/2022, MD Last refill: 02/25/2022 [facility] [place], [place] - [address]

## 2022-04-10 NOTE — Telephone Encounter (Signed)
Pt has not returned call at this time. Closing out this message.

## 2022-07-13 ENCOUNTER — Telehealth: Payer: Self-pay

## 2022-07-13 DIAGNOSIS — M542 Cervicalgia: Secondary | ICD-10-CM

## 2022-07-13 NOTE — Telephone Encounter (Signed)
Patient notified

## 2022-07-13 NOTE — Telephone Encounter (Signed)
-----   Message from Peggyann Shoals sent at 07/13/2022 12:09 PM EDT ----- Regarding: neck xrays Contact: [phone]  T4-L3 PSF on 11/14/21//xrays today Patient's appt is on 11/9 for recheck. He has been having neck pain radiating into his right arm for a few months. Since he is coming from far away can Dr.Yarbrough order xrays of his neck and discuss this with him that day? I told him that I would have to ask.

## 2022-07-13 NOTE — Telephone Encounter (Signed)
Dr Darreld Mclean said yes to adding neck xrays. I put the order in for them.

## 2022-07-24 ENCOUNTER — Other Ambulatory Visit: Payer: Self-pay

## 2022-07-24 DIAGNOSIS — Z981 Arthrodesis status: Secondary | ICD-10-CM

## 2022-07-27 ENCOUNTER — Ambulatory Visit (INDEPENDENT_AMBULATORY_CARE_PROVIDER_SITE_OTHER): Payer: Medicare Other | Admitting: Neurosurgery

## 2022-07-27 ENCOUNTER — Encounter: Payer: Self-pay | Admitting: Neurosurgery

## 2022-07-27 ENCOUNTER — Ambulatory Visit
Admission: RE | Admit: 2022-07-27 | Discharge: 2022-07-27 | Disposition: A | Discharge status: Home / Self Care | Payer: Medicare Other | Source: Ambulatory Visit | Attending: Neurosurgery | Admitting: Neurosurgery

## 2022-07-27 VITALS — BP 126/78 | Ht 68.0 in | Wt 239.0 lb

## 2022-07-27 DIAGNOSIS — Z09 Encounter for follow-up examination after completed treatment for conditions other than malignant neoplasm: Secondary | ICD-10-CM

## 2022-07-27 DIAGNOSIS — Z981 Arthrodesis status: Secondary | ICD-10-CM

## 2022-07-27 DIAGNOSIS — M5134 Other intervertebral disc degeneration, thoracic region: Secondary | ICD-10-CM | POA: Diagnosis not present

## 2022-07-27 DIAGNOSIS — M542 Cervicalgia: Secondary | ICD-10-CM | POA: Diagnosis present

## 2022-07-27 DIAGNOSIS — M5412 Radiculopathy, cervical region: Secondary | ICD-10-CM

## 2022-07-27 NOTE — Progress Notes (Signed)
   REFERRING PHYSICIAN: Bethel Born, [place] [address] [place], [place] [address]  DOS: Griselda Miner PSF  HISTORY OF PRESENT ILLNESS: Zaylin Runco is status post thoracolumbar fusion. He is doing fair.  He is having pain in between his shoulder blades.  He thinks his posture may have changed.  He is also having pain on his right arm.  PHYSICAL EXAMINATION: General: Patient is well developed, well nourished, calm, collected, and in no apparent distress.  NEUROLOGICAL: General: In no acute distress. Awake, alert, oriented to person, place, and time. Pupils equal round and reactive to light.  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    ASSESSMENT/PLAN: Jodey Burbano is doing fair after thoracolumbar fusion.  He has some symptoms of right-sided cervical radiculopathy.  I would like to compare his x-rays to his prior x-rays to confirm that he has had a change at T3-4.  That may necessitate getting the thoracic spine CT scan as well as cervical spine MRI scan.  He may ultimately need surgical intervention.  MD, [facility] Department of Neurosurgery

## 2022-07-28 ENCOUNTER — Ambulatory Visit
Admission: RE | Admit: 2022-07-28 | Discharge: 2022-07-28 | Disposition: A | Discharge status: Home / Self Care | Payer: Self-pay | Source: Ambulatory Visit | Attending: Neurosurgery | Admitting: Neurosurgery

## 2022-07-28 ENCOUNTER — Other Ambulatory Visit: Payer: Self-pay

## 2022-07-28 DIAGNOSIS — Z049 Encounter for examination and observation for unspecified reason: Secondary | ICD-10-CM

## 2022-07-31 ENCOUNTER — Telehealth: Payer: Self-pay

## 2022-07-31 ENCOUNTER — Other Ambulatory Visit: Payer: Self-pay | Admitting: Neurosurgery

## 2022-07-31 DIAGNOSIS — M5134 Other intervertebral disc degeneration, thoracic region: Secondary | ICD-10-CM

## 2022-07-31 DIAGNOSIS — M5412 Radiculopathy, cervical region: Secondary | ICD-10-CM

## 2022-07-31 NOTE — Telephone Encounter (Signed)
-----   Message from Venetia Night, MD sent at 07/31/2022 10:14 AM EST ----- Regarding: RE: images It's getting worse.  I put in orders for thoracic CT and cervical MRI ----- Message ----- From: Sharlot Gowda, RN Sent: 07/28/2022  12:50 PM EST To: Venetia Night, MD Subject: images                                         I have a bunch of his images pulled over now

## 2022-07-31 NOTE — Telephone Encounter (Signed)
I notified Bryce Williams. He would like to have the studies done at [facility] in [place].    He asked that we notify him once the orders have been sent.  I asked that he notify us once the studies have been completed.

## 2022-08-01 NOTE — Telephone Encounter (Signed)
I have sent the patient a mychart message.

## 2022-08-14 NOTE — Telephone Encounter (Signed)
He had the scans on Friday. Have the results been received?

## 2022-08-16 ENCOUNTER — Other Ambulatory Visit: Payer: Self-pay

## 2022-08-16 ENCOUNTER — Ambulatory Visit
Admission: RE | Admit: 2022-08-16 | Discharge: 2022-08-16 | Disposition: A | Discharge status: Home / Self Care | Payer: Self-pay | Source: Ambulatory Visit | Attending: Neurosurgery | Admitting: Neurosurgery

## 2022-08-16 DIAGNOSIS — Z049 Encounter for examination and observation for unspecified reason: Secondary | ICD-10-CM

## 2022-08-17 NOTE — Telephone Encounter (Signed)
Patient calling back if we have his results. Bryce Williams said that we are only waiting on the CT images. I told patient to give [place] another 24 hours for results.

## 2022-08-24 ENCOUNTER — Ambulatory Visit (INDEPENDENT_AMBULATORY_CARE_PROVIDER_SITE_OTHER): Payer: Medicare Other | Admitting: Neurosurgery

## 2022-08-24 DIAGNOSIS — M5134 Other intervertebral disc degeneration, thoracic region: Secondary | ICD-10-CM

## 2022-08-24 DIAGNOSIS — Z09 Encounter for follow-up examination after completed treatment for conditions other than malignant neoplasm: Secondary | ICD-10-CM | POA: Diagnosis not present

## 2022-08-24 DIAGNOSIS — Z981 Arthrodesis status: Secondary | ICD-10-CM | POA: Diagnosis not present

## 2022-08-24 NOTE — Progress Notes (Signed)
   REFERRING PHYSICIAN: Bethel Born, [place] [address] [place], [place] [address]  DOS: Griselda Miner PSF  HISTORY OF PRESENT ILLNESS: Bryce Williams is status post thoracolumbar fusion.  He is still having significant pain at the top of his construct.  He is also having pain into his right shoulder blade and down to his fourth and fifth digits of his right hand.  The pain in the upper part of his construct is the biggest issue.   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    MRI of the cervical spine on 08/11/2022 shows moderate stenosis at C4-5 and C6-7.  He has mild stenosis at C3-4.  He has a prior anterior cervical discectomy and fusion at C5-6.  He has moderate bilateral foraminal stenosis at C3-4 and severe bilateral foraminal stenosis at C4-5 and C6-7.  CT scan of the thoracic spine on August 11, 2022 shows postsurgical changes with old compression fracture of T9 with moderate height loss.  ASSESSMENT/PLAN: Bryce Williams is doing fair after thoracolumbar fusion.    I think he has some symptoms of right-sided radiculopathy though his fourth and fifth digit symptoms would not be referable to his cervical spine findings.  The biggest issue is pain at the upper part of his construct.  He has implied instability and proximal junctional kyphosis.  He has developed additional severe degenerative disease at T3-4 on his CT scan.  I think the most appropriate step forward would be to extend his fusion to T2.  I encouraged him to think about this and let me know if he would like to move forward.  This visit was performed via telephone.  Patient location: home Provider location: office  I spent a total of 15 minutes non-face-to-face activities for this visit on the date of this encounter including review of current clinical condition and response to treatment.  Venetia Night MD, [facility] Department of  Neurosurgery

## 2022-08-29 ENCOUNTER — Telehealth: Payer: Self-pay

## 2022-08-29 NOTE — Telephone Encounter (Signed)
-----   Message from Rockey Situ sent at 08/29/2022  9:27 AM EST ----- Regarding: schedule sx Contact: [phone] Patient is calling back her wants to move forward with surgery. He send a message through mychart yesterday. He would like the week of October 02, 2022. You can respond through mychart too.

## 2022-08-30 ENCOUNTER — Other Ambulatory Visit: Payer: Self-pay

## 2022-08-30 DIAGNOSIS — Z01818 Encounter for other preprocedural examination: Secondary | ICD-10-CM

## 2022-08-30 NOTE — Telephone Encounter (Signed)
I spoke with Mr Biggins. We discussed the following:   Planned surgery: extension of fusion to T2   Surgery date: 10/02/22 - you will find out your arrival time the business day before your surgery.   Pre-op appointment at [facility] Pre-admit Testing: we will call you with a date/time for this. Pre-admit testing is located on the first floor of the [facility] building, [address] [facility] [address]. Please bring all prescriptions in the original prescription bottles to your appointment, even if you have reviewed medications by phone with a pharmacy representative. Pre-op labs may be done at your pre-op appointment. You are not required to fast for these labs. Should you need to change your pre-op appointment, please call Pre-admit testing at [phone].    Blood thinners:  Eliquis:   stop Eliquis 72 hours prior, resume Eliquis 14 days after   NSAIDS (Non-steroidal anti-inflammatory drugs): because you are having a fusion, no NSAIDS (such as ibuprofen, aleve, naproxen, meloxicam, diclofenac) for 3 months after surgery. Celebrex is an exception. Tylenol is ok because it is not an NSAID.   Because you are having a fusion or arthroplasty: for appointments after your 2 week follow-up: please arrive at the [facility] outpatient imaging center ([address], [address], [place]) or [facility] one hour prior to your appointment for x-rays. This applies to every appointment after your 2 week follow-up. Failure to do so may result in your appointment being rescheduled.   If you have FMLA/disability paperwork, please drop it off or fax it to [phone], attention [place].   We can be reached by phone or mychart 8am-4pm, Monday-Friday. If you have any questions/concerns before or after surgery, you can reach [place] at [phone], or you can send a mychart message. If you have a concern after hours that cannot wait until normal business hours, you can call  [phone] and ask to page the neurosurgeon on call for [place].    Appointments/FMLA & disability paperwork: [place] Nurse: Royston Cowper  Medical assistant: Irving Burton Physician Assistant's: Manning Charity & Drake Leach Surgeon: Venetia Night, MD

## 2022-09-20 ENCOUNTER — Encounter
Admission: RE | Admit: 2022-09-20 | Discharge: 2022-09-20 | Disposition: A | Discharge status: Home / Self Care | Payer: Medicare Other | Source: Ambulatory Visit | Attending: Neurosurgery | Admitting: Neurosurgery

## 2022-09-20 ENCOUNTER — Other Ambulatory Visit: Payer: Self-pay

## 2022-09-20 DIAGNOSIS — Z981 Arthrodesis status: Secondary | ICD-10-CM | POA: Diagnosis not present

## 2022-09-20 DIAGNOSIS — Z01818 Encounter for other preprocedural examination: Secondary | ICD-10-CM | POA: Diagnosis not present

## 2022-09-20 DIAGNOSIS — M5134 Other intervertebral disc degeneration, thoracic region: Secondary | ICD-10-CM | POA: Insufficient documentation

## 2022-09-20 DIAGNOSIS — Z01812 Encounter for preprocedural laboratory examination: Secondary | ICD-10-CM

## 2022-09-20 LAB — CBC
HCT: 38.5 % — ABNORMAL LOW (ref 39.0–52.0)
Hemoglobin: 11.9 g/dL — ABNORMAL LOW (ref 13.0–17.0)
MCH: 21.3 pg — ABNORMAL LOW (ref 26.0–34.0)
MCHC: 30.9 g/dL (ref 30.0–36.0)
MCV: 68.9 fL — ABNORMAL LOW (ref 80.0–100.0)
Platelets: 179 10*3/uL (ref 150–400)
RBC: 5.59 MIL/uL (ref 4.22–5.81)
RDW: 16.8 % — ABNORMAL HIGH (ref 11.5–15.5)
WBC: 5.8 10*3/uL (ref 4.0–10.5)
nRBC: 0 % (ref 0.0–0.2)

## 2022-09-20 LAB — BASIC METABOLIC PANEL
Anion gap: 10 (ref 5–15)
BUN: 14 mg/dL (ref 8–23)
CO2: 27 mmol/L (ref 22–32)
Calcium: 9.2 mg/dL (ref 8.9–10.3)
Chloride: 101 mmol/L (ref 98–111)
Creatinine, Ser: 1.01 mg/dL (ref 0.61–1.24)
GFR, Estimated: >60 mL/min (ref >60)
Glucose, Bld: 96 mg/dL (ref 70–99)
Potassium: 3.4 mmol/L — ABNORMAL LOW (ref 3.5–5.1)
Sodium: 138 mmol/L (ref 135–145)

## 2022-09-20 LAB — SURGICAL PCR SCREEN
MRSA, PCR: NEGATIVE
Staphylococcus aureus: POSITIVE — AB

## 2022-09-20 LAB — TYPE AND SCREEN
ABO/RH(D): A POS
Antibody Screen: NEGATIVE

## 2022-09-20 NOTE — Progress Notes (Signed)
Received cardiac clearance from Dr Georga Hacking Md [facility]-Pt optimized for surgery

## 2022-09-20 NOTE — Patient Instructions (Addendum)
Your procedure is scheduled on: 10/02/22 - Monday Report to the Registration Desk on the 1st floor of the [facility]. To find out your arrival time, please call [phone] between 1PM - 3PM on: 09/29/22 - Friday If your arrival time is 6:00 am, do not arrive prior to that time as the [facility] entrance doors do not open until 6:00 am.  REMEMBER: Instructions that are not followed completely may result in serious medical risk, up to and including death; or upon the discretion of your surgeon and anesthesiologist your surgery may need to be rescheduled.  Do not eat food after midnight the night before surgery.                  No gum chewing, lozengers or hard candies.  You may however, drink CLEAR liquids up to 2 hours before you are scheduled to arrive for your surgery. Do not drink anything within 2 hours of your scheduled arrival time.  Clear liquids include: - water  - apple juice without pulp - gatorade (not RED colors) - black coffee or tea (Do NOT add milk or creamers to the coffee or tea) Do NOT drink anything that is not on this list.  TAKE THESE MEDICATIONS THE MORNING OF SURGERY WITH A SIP OF WATER:  - atorvastatin (LIPITOR)  - DULoxetine (CYMBALTA)  - levETIRAcetam (KEPPRA)  - celecoxib (CELEBREX) 200 MG capsule  - hydrOXYzine (ATARAX) if needed - Oxycodone HCl if needed.  Hold apixaban (ELIQUIS) 3 days prior to your surgery beginning 09/29/22, you may resume taking 14 days after your surgery.  One week prior to surgery: Stop Anti-inflammatories (NSAIDS) such as Advil, Aleve, Ibuprofen, Motrin, Naproxen, Naprosyn and Aspirin based products such as Excedrin, Goodys Powder, BC Powder.  Stop ANY OVER THE COUNTER supplements until after surgery.  You may however, continue to take Tylenol if needed for pain up until the day of surgery.  No Alcohol for 24 hours before or after surgery.  No Smoking including e-cigarettes for 24 hours prior to surgery.  No  chewable tobacco products for at least 6 hours prior to surgery.  No nicotine patches on the day of surgery.  Do not use any "recreational" drugs for at least a week prior to your surgery.  Please be advised that the combination of cocaine and anesthesia may have negative outcomes, up to and including death. If you test positive for cocaine, your surgery will be cancelled.  On the morning of surgery brush your teeth with toothpaste and water, you may rinse your mouth with mouthwash if you wish. Do not swallow any toothpaste or mouthwash.  Use CHG Soap or wipes as directed on instruction sheet.  Do not wear jewelry, make-up, hairpins, clips or nail polish.  Do not wear lotions, powders, or perfumes.   Do not shave body from the neck down 48 hours prior to surgery just in case you cut yourself which could leave a site for infection.  Also, freshly shaved skin may become irritated if using the CHG soap.  Contact lenses, hearing aids and dentures may not be worn into surgery.  Do not bring valuables to the hospital. [facility] is not responsible for any missing/lost belongings or valuables.   Notify your doctor if there is any change in your medical condition (cold, fever, infection).  Wear comfortable clothing (specific to your surgery type) to the hospital.  After surgery, you can help prevent lung complications by doing breathing exercises.  Take deep breaths  and cough every 1-2 hours. Your doctor may order a device called an Incentive Spirometer to help you take deep breaths. When coughing or sneezing, hold a pillow firmly against your incision with both hands. This is called "splinting." Doing this helps protect your incision. It also decreases belly discomfort.  If you are being admitted to the hospital overnight, leave your suitcase in the car. After surgery it may be brought to your room.  If you are being discharged the day of surgery, you will not be allowed to drive  home. You will need a responsible adult (18 years or older) to drive you home and stay with you that night.   If you are taking public transportation, you will need to have a responsible adult (18 years or older) with you. Please confirm with your physician that it is acceptable to use public transportation.   Please call the [place] Dept. at [phone] if you have any questions about these instructions.  Surgery Visitation Policy:  Patients undergoing a surgery or procedure may have two family members or support persons with them as long as the person is not COVID-19 positive or experiencing its symptoms.   Inpatient Visitation:    Visiting hours are 7 a.m. to 8 p.m. Up to four visitors are allowed at one time in a patient room. The visitors may rotate out with other people during the day. One designated support person (adult) may remain overnight.  Due to an increase in RSV and influenza rates and associated hospitalizations, children ages 69 and under will not be able to visit patients in [facility]. Masks continue to be strongly recommended.

## 2022-09-25 ENCOUNTER — Encounter: Payer: Self-pay | Admitting: Neurosurgery

## 2022-10-02 ENCOUNTER — Other Ambulatory Visit: Payer: Self-pay

## 2022-10-02 ENCOUNTER — Inpatient Hospital Stay: Payer: Medicare Other

## 2022-10-02 ENCOUNTER — Encounter: Payer: Self-pay | Admitting: Neurosurgery

## 2022-10-02 ENCOUNTER — Encounter
Admission: RE | Disposition: A | Discharge status: Home Health | Payer: Self-pay | Source: Home / Self Care | Attending: Neurosurgery

## 2022-10-02 ENCOUNTER — Inpatient Hospital Stay: Payer: Medicare Other | Admitting: Anesthesiology

## 2022-10-02 ENCOUNTER — Inpatient Hospital Stay
Admission: RE | Admit: 2022-10-02 | Discharge: 2022-10-05 | DRG: 460 | Disposition: A | Discharge status: Home Health | Payer: Medicare Other | Attending: Neurosurgery | Admitting: Neurosurgery

## 2022-10-02 DIAGNOSIS — Z981 Arthrodesis status: Secondary | ICD-10-CM

## 2022-10-02 DIAGNOSIS — Z79891 Long term (current) use of opiate analgesic: Secondary | ICD-10-CM

## 2022-10-02 DIAGNOSIS — Z87891 Personal history of nicotine dependence: Secondary | ICD-10-CM | POA: Diagnosis not present

## 2022-10-02 DIAGNOSIS — M4802 Spinal stenosis, cervical region: Secondary | ICD-10-CM | POA: Diagnosis present

## 2022-10-02 DIAGNOSIS — Z79899 Other long term (current) drug therapy: Secondary | ICD-10-CM | POA: Diagnosis not present

## 2022-10-02 DIAGNOSIS — Z01818 Encounter for other preprocedural examination: Secondary | ICD-10-CM

## 2022-10-02 DIAGNOSIS — Z01812 Encounter for preprocedural laboratory examination: Secondary | ICD-10-CM

## 2022-10-02 DIAGNOSIS — M960 Pseudarthrosis after fusion or arthrodesis: Secondary | ICD-10-CM | POA: Diagnosis present

## 2022-10-02 DIAGNOSIS — Y838 Other surgical procedures as the cause of abnormal reaction of the patient, or of later complication, without mention of misadventure at the time of the procedure: Secondary | ICD-10-CM | POA: Diagnosis present

## 2022-10-02 DIAGNOSIS — M5134 Other intervertebral disc degeneration, thoracic region: Secondary | ICD-10-CM

## 2022-10-02 HISTORY — PX: APPLICATION OF INTRAOPERATIVE CT SCAN: SHX6668

## 2022-10-02 SURGERY — POSTERIOR THORACIC FUSION 2 LEVEL
Anesthesia: General | Site: Spine Thoracic

## 2022-10-02 MED ORDER — ATORVASTATIN CALCIUM 20 MG PO TABS
40.0000 mg | ORAL_TABLET | Freq: Every morning | ORAL | Status: DC
  Administered 2022-10-03 – 2022-10-05 (×3): 40 mg via ORAL
  Filled 2022-10-02 (×3): qty 2

## 2022-10-02 MED ORDER — KETAMINE HCL 10 MG/ML IJ SOLN
INTRAMUSCULAR | Status: DC | PRN
  Administered 2022-10-02: 30 mg via INTRAVENOUS
  Administered 2022-10-02 (×2): 10 mg via INTRAVENOUS

## 2022-10-02 MED ORDER — OXYCODONE HCL 5 MG PO TABS
15.0000 mg | ORAL_TABLET | ORAL | Status: DC | PRN
  Administered 2022-10-02 – 2022-10-04 (×10): 15 mg via ORAL
  Filled 2022-10-02 (×10): qty 3

## 2022-10-02 MED ORDER — FAMOTIDINE 20 MG PO TABS
ORAL_TABLET | ORAL | Status: AC
  Filled 2022-10-02: qty 1

## 2022-10-02 MED ORDER — KETOROLAC TROMETHAMINE 30 MG/ML IJ SOLN
INTRAMUSCULAR | Status: AC
  Filled 2022-10-02: qty 1

## 2022-10-02 MED ORDER — ONDANSETRON HCL 4 MG/2ML IJ SOLN
INTRAMUSCULAR | Status: DC | PRN
  Administered 2022-10-02: 4 mg via INTRAVENOUS

## 2022-10-02 MED ORDER — DOCUSATE SODIUM 100 MG PO CAPS
100.0000 mg | ORAL_CAPSULE | Freq: Two times a day (BID) | ORAL | Status: DC
  Administered 2022-10-02 – 2022-10-05 (×6): 100 mg via ORAL
  Filled 2022-10-02 (×6): qty 1

## 2022-10-02 MED ORDER — POLYETHYLENE GLYCOL 3350 17 G PO PACK: 17.0000 g | PACK | Freq: Every day | ORAL | Status: DC | PRN

## 2022-10-02 MED ORDER — SUCCINYLCHOLINE CHLORIDE 200 MG/10ML IV SOSY
PREFILLED_SYRINGE | INTRAVENOUS | Status: DC | PRN
  Administered 2022-10-02: 100 mg via INTRAVENOUS

## 2022-10-02 MED ORDER — VANCOMYCIN HCL IN DEXTROSE 1-5 GM/200ML-% IV SOLN: 1000.0000 mg | Freq: Once | INTRAVENOUS | Status: AC

## 2022-10-02 MED ORDER — BUPIVACAINE-EPINEPHRINE (PF) 0.5% -1:200000 IJ SOLN
INTRAMUSCULAR | Status: AC
  Filled 2022-10-02: qty 30

## 2022-10-02 MED ORDER — ACETAMINOPHEN 10 MG/ML IV SOLN
INTRAVENOUS | Status: DC | PRN
  Administered 2022-10-02: 1000 mg via INTRAVENOUS

## 2022-10-02 MED ORDER — HYDROMORPHONE HCL 1 MG/ML IJ SOLN
INTRAMUSCULAR | Status: AC
  Filled 2022-10-02: qty 1

## 2022-10-02 MED ORDER — HYDROCHLOROTHIAZIDE 25 MG PO TABS
25.0000 mg | ORAL_TABLET | Freq: Every morning | ORAL | Status: DC
  Administered 2022-10-03 – 2022-10-05 (×3): 25 mg via ORAL
  Filled 2022-10-02 (×3): qty 1

## 2022-10-02 MED ORDER — LITHIUM CARBONATE 300 MG PO CAPS
300.0000 mg | ORAL_CAPSULE | Freq: Every day | ORAL | Status: DC
  Administered 2022-10-02 – 2022-10-05 (×4): 300 mg via ORAL
  Filled 2022-10-02 (×5): qty 1

## 2022-10-02 MED ORDER — ONDANSETRON HCL 4 MG/2ML IJ SOLN: 4.0000 mg | Freq: Four times a day (QID) | INTRAMUSCULAR | Status: DC | PRN

## 2022-10-02 MED ORDER — HYDROMORPHONE HCL 1 MG/ML IJ SOLN
0.5000 mg | INTRAMUSCULAR | Status: AC | PRN
  Administered 2022-10-02 – 2022-10-04 (×6): 0.5 mg via INTRAVENOUS
  Filled 2022-10-02 (×6): qty 1

## 2022-10-02 MED ORDER — HYDRALAZINE HCL 20 MG/ML IJ SOLN
INTRAMUSCULAR | Status: AC
  Filled 2022-10-02: qty 1

## 2022-10-02 MED ORDER — BISACODYL 10 MG RE SUPP: 10.0000 mg | Freq: Every day | RECTAL | Status: DC | PRN

## 2022-10-02 MED ORDER — SURGIPHOR WOUND IRRIGATION SYSTEM - OPTIME: TOPICAL | Status: DC | PRN

## 2022-10-02 MED ORDER — CEFAZOLIN SODIUM-DEXTROSE 2-4 GM/100ML-% IV SOLN
INTRAVENOUS | Status: AC
  Filled 2022-10-02: qty 100

## 2022-10-02 MED ORDER — ORAL CARE MOUTH RINSE: 15.0000 mL | Freq: Once | OROMUCOSAL | Status: AC

## 2022-10-02 MED ORDER — HYDROXYZINE HCL 25 MG PO TABS
25.0000 mg | ORAL_TABLET | Freq: Three times a day (TID) | ORAL | Status: DC | PRN
  Administered 2022-10-04: 25 mg via ORAL
  Filled 2022-10-02 (×2): qty 1

## 2022-10-02 MED ORDER — VANCOMYCIN HCL 1000 MG IV SOLR
INTRAVENOUS | Status: AC
  Filled 2022-10-02: qty 20

## 2022-10-02 MED ORDER — ACETAMINOPHEN 10 MG/ML IV SOLN: 1000.0000 mg | Freq: Once | INTRAVENOUS | Status: DC | PRN

## 2022-10-02 MED ORDER — MIDAZOLAM HCL 2 MG/2ML IJ SOLN
INTRAMUSCULAR | Status: AC
  Filled 2022-10-02: qty 2

## 2022-10-02 MED ORDER — SODIUM CHLORIDE 0.9% FLUSH: 3.0000 mL | INTRAVENOUS | Status: DC | PRN

## 2022-10-02 MED ORDER — SODIUM CHLORIDE 0.9% FLUSH
3.0000 mL | Freq: Two times a day (BID) | INTRAVENOUS | Status: DC
  Administered 2022-10-02 – 2022-10-05 (×6): 3 mL via INTRAVENOUS

## 2022-10-02 MED ORDER — DEXAMETHASONE SODIUM PHOSPHATE 10 MG/ML IJ SOLN
INTRAMUSCULAR | Status: AC
  Filled 2022-10-02: qty 1

## 2022-10-02 MED ORDER — SURGIFLO WITH THROMBIN (HEMOSTATIC MATRIX KIT) OPTIME
TOPICAL | Status: DC | PRN
  Administered 2022-10-02 (×2): 1 via TOPICAL

## 2022-10-02 MED ORDER — 0.9 % SODIUM CHLORIDE (POUR BTL) OPTIME
TOPICAL | Status: DC | PRN
  Administered 2022-10-02: 500 mL

## 2022-10-02 MED ORDER — PNEUMOCOCCAL 20-VAL CONJ VACC 0.5 ML IM SUSY: 0.5000 mL | PREFILLED_SYRINGE | INTRAMUSCULAR | Status: DC

## 2022-10-02 MED ORDER — SODIUM CHLORIDE 0.9 % IV SOLN: INTRAVENOUS | Status: DC

## 2022-10-02 MED ORDER — SODIUM CHLORIDE FLUSH 0.9 % IV SOLN
INTRAVENOUS | Status: AC
  Filled 2022-10-02: qty 20

## 2022-10-02 MED ORDER — GLYCOPYRROLATE 0.2 MG/ML IJ SOLN
INTRAMUSCULAR | Status: DC | PRN
  Administered 2022-10-02: .2 mg via INTRAVENOUS

## 2022-10-02 MED ORDER — MAGNESIUM CITRATE PO SOLN: 1.0000 | Freq: Once | ORAL | Status: DC | PRN

## 2022-10-02 MED ORDER — SODIUM CHLORIDE 0.9 % IV SOLN: 250.0000 mL | INTRAVENOUS | Status: DC

## 2022-10-02 MED ORDER — CEFAZOLIN IN SODIUM CHLORIDE 2-0.9 GM/100ML-% IV SOLN
2.0000 g | Freq: Once | INTRAVENOUS | Status: AC
  Administered 2022-10-02: 2 g via INTRAVENOUS

## 2022-10-02 MED ORDER — BUPIVACAINE HCL (PF) 0.5 % IJ SOLN
INTRAMUSCULAR | Status: AC
  Filled 2022-10-02: qty 30

## 2022-10-02 MED ORDER — VANCOMYCIN HCL 1000 MG IV SOLR
INTRAVENOUS | Status: DC | PRN
  Administered 2022-10-02: 1000 mg via TOPICAL

## 2022-10-02 MED ORDER — MIDAZOLAM HCL 2 MG/2ML IJ SOLN
INTRAMUSCULAR | Status: DC | PRN
  Administered 2022-10-02: 2 mg via INTRAVENOUS

## 2022-10-02 MED ORDER — KETOROLAC TROMETHAMINE 15 MG/ML IJ SOLN
15.0000 mg | Freq: Four times a day (QID) | INTRAMUSCULAR | Status: AC
  Administered 2022-10-02 – 2022-10-03 (×4): 15 mg via INTRAVENOUS
  Filled 2022-10-02 (×4): qty 1

## 2022-10-02 MED ORDER — MENTHOL 3 MG MT LOZG: 1.0000 | LOZENGE | OROMUCOSAL | Status: DC | PRN

## 2022-10-02 MED ORDER — METHOCARBAMOL 500 MG PO TABS
1000.0000 mg | ORAL_TABLET | Freq: Three times a day (TID) | ORAL | Status: DC
  Administered 2022-10-03 – 2022-10-05 (×7): 1000 mg via ORAL
  Filled 2022-10-02 (×8): qty 2

## 2022-10-02 MED ORDER — FENTANYL CITRATE (PF) 100 MCG/2ML IJ SOLN
INTRAMUSCULAR | Status: AC
  Filled 2022-10-02: qty 2

## 2022-10-02 MED ORDER — ROCURONIUM BROMIDE 100 MG/10ML IV SOLN
INTRAVENOUS | Status: DC | PRN
  Administered 2022-10-02: 20 mg via INTRAVENOUS
  Administered 2022-10-02: 30 mg via INTRAVENOUS
  Administered 2022-10-02 (×4): 20 mg via INTRAVENOUS

## 2022-10-02 MED ORDER — HYDRALAZINE HCL 20 MG/ML IJ SOLN
10.0000 mg | Freq: Once | INTRAMUSCULAR | Status: AC
  Administered 2022-10-02: 10 mg via INTRAVENOUS

## 2022-10-02 MED ORDER — DEXMEDETOMIDINE HCL IN NACL 200 MCG/50ML IV SOLN
INTRAVENOUS | Status: DC | PRN
  Administered 2022-10-02: 8 ug via INTRAVENOUS
  Administered 2022-10-02 (×2): 4 ug via INTRAVENOUS
  Administered 2022-10-02: 8 ug via INTRAVENOUS

## 2022-10-02 MED ORDER — ENOXAPARIN SODIUM 40 MG/0.4ML IJ SOSY
40.0000 mg | PREFILLED_SYRINGE | INTRAMUSCULAR | Status: DC
  Administered 2022-10-03 – 2022-10-05 (×3): 40 mg via SUBCUTANEOUS
  Filled 2022-10-02 (×3): qty 0.4

## 2022-10-02 MED ORDER — KETAMINE HCL 50 MG/5ML IJ SOSY
PREFILLED_SYRINGE | INTRAMUSCULAR | Status: AC
  Filled 2022-10-02: qty 5

## 2022-10-02 MED ORDER — ONDANSETRON HCL 4 MG PO TABS: 4.0000 mg | ORAL_TABLET | Freq: Four times a day (QID) | ORAL | Status: DC | PRN

## 2022-10-02 MED ORDER — GLYCOPYRROLATE 0.2 MG/ML IJ SOLN
INTRAMUSCULAR | Status: AC
  Filled 2022-10-02: qty 2

## 2022-10-02 MED ORDER — LEVETIRACETAM 500 MG PO TABS
500.0000 mg | ORAL_TABLET | Freq: Two times a day (BID) | ORAL | Status: DC
  Administered 2022-10-02 – 2022-10-05 (×6): 500 mg via ORAL
  Filled 2022-10-02 (×6): qty 1

## 2022-10-02 MED ORDER — CHLORHEXIDINE GLUCONATE 0.12 % MT SOLN
15.0000 mL | Freq: Once | OROMUCOSAL | Status: AC
  Administered 2022-10-02: 15 mL via OROMUCOSAL

## 2022-10-02 MED ORDER — LACTATED RINGERS IV SOLN: INTRAVENOUS | Status: DC

## 2022-10-02 MED ORDER — PROPOFOL 10 MG/ML IV BOLUS
INTRAVENOUS | Status: DC | PRN
  Administered 2022-10-02: 200 mg via INTRAVENOUS

## 2022-10-02 MED ORDER — ONDANSETRON HCL 4 MG/2ML IJ SOLN: 4.0000 mg | Freq: Once | INTRAMUSCULAR | Status: DC | PRN

## 2022-10-02 MED ORDER — METHOCARBAMOL 500 MG PO TABS: 500.0000 mg | ORAL_TABLET | Freq: Four times a day (QID) | ORAL | Status: DC | PRN

## 2022-10-02 MED ORDER — METHOCARBAMOL 1000 MG/10ML IJ SOLN
500.0000 mg | Freq: Four times a day (QID) | INTRAVENOUS | Status: DC | PRN
  Administered 2022-10-02: 500 mg via INTRAVENOUS
  Filled 2022-10-02: qty 5

## 2022-10-02 MED ORDER — FENTANYL CITRATE (PF) 100 MCG/2ML IJ SOLN
25.0000 ug | INTRAMUSCULAR | Status: DC | PRN
  Administered 2022-10-02 (×2): 50 ug via INTRAVENOUS

## 2022-10-02 MED ORDER — OXYCODONE HCL 5 MG PO TABS
5.0000 mg | ORAL_TABLET | ORAL | Status: DC | PRN
  Administered 2022-10-04 – 2022-10-05 (×5): 10 mg via ORAL
  Filled 2022-10-02 (×5): qty 2

## 2022-10-02 MED ORDER — ACETAMINOPHEN 500 MG PO TABS
1000.0000 mg | ORAL_TABLET | Freq: Four times a day (QID) | ORAL | Status: AC
  Administered 2022-10-02 – 2022-10-03 (×3): 1000 mg via ORAL
  Filled 2022-10-02 (×4): qty 2

## 2022-10-02 MED ORDER — HYDROMORPHONE HCL 1 MG/ML IJ SOLN
INTRAMUSCULAR | Status: DC | PRN
  Administered 2022-10-02 (×2): 1 mg via INTRAVENOUS

## 2022-10-02 MED ORDER — SENNA 8.6 MG PO TABS
1.0000 | ORAL_TABLET | Freq: Two times a day (BID) | ORAL | Status: DC
  Administered 2022-10-02 – 2022-10-05 (×6): 8.6 mg via ORAL
  Filled 2022-10-02 (×6): qty 1

## 2022-10-02 MED ORDER — TRAZODONE HCL 100 MG PO TABS
200.0000 mg | ORAL_TABLET | Freq: Every day | ORAL | Status: DC
  Administered 2022-10-02 – 2022-10-04 (×3): 200 mg via ORAL
  Filled 2022-10-02 (×3): qty 2

## 2022-10-02 MED ORDER — VANCOMYCIN HCL 1000 MG IV SOLR
INTRAVENOUS | Status: DC | PRN
  Administered 2022-10-02: 1000 mg via INTRAVENOUS

## 2022-10-02 MED ORDER — PHENOL 1.4 % MT LIQD: 1.0000 | OROMUCOSAL | Status: DC | PRN

## 2022-10-02 MED ORDER — LIDOCAINE HCL (CARDIAC) PF 100 MG/5ML IV SOSY
PREFILLED_SYRINGE | INTRAVENOUS | Status: DC | PRN
  Administered 2022-10-02: 100 mg via INTRAVENOUS

## 2022-10-02 MED ORDER — DIAZEPAM 5 MG PO TABS
5.0000 mg | ORAL_TABLET | Freq: Three times a day (TID) | ORAL | Status: DC | PRN
  Administered 2022-10-02 – 2022-10-04 (×3): 5 mg via ORAL
  Filled 2022-10-02 (×3): qty 1

## 2022-10-02 MED ORDER — PHENYLEPHRINE HCL-NACL 20-0.9 MG/250ML-% IV SOLN
INTRAVENOUS | Status: AC
  Filled 2022-10-02: qty 500

## 2022-10-02 MED ORDER — FAMOTIDINE 20 MG PO TABS
20.0000 mg | ORAL_TABLET | Freq: Once | ORAL | Status: AC
  Administered 2022-10-02: 20 mg via ORAL

## 2022-10-02 MED ORDER — OXYCODONE HCL 5 MG PO TABS
10.0000 mg | ORAL_TABLET | ORAL | Status: DC | PRN
  Administered 2022-10-02: 10 mg via ORAL
  Filled 2022-10-02: qty 2

## 2022-10-02 MED ORDER — ARIPIPRAZOLE 2 MG PO TABS
20.0000 mg | ORAL_TABLET | Freq: Every day | ORAL | Status: DC
  Administered 2022-10-02 – 2022-10-04 (×3): 20 mg via ORAL
  Filled 2022-10-02 (×3): qty 10

## 2022-10-02 MED ORDER — CHLORHEXIDINE GLUCONATE 0.12 % MT SOLN
OROMUCOSAL | Status: AC
  Filled 2022-10-02: qty 15

## 2022-10-02 MED ORDER — METHOCARBAMOL 1000 MG/10ML IJ SOLN
500.0000 mg | Freq: Three times a day (TID) | INTRAVENOUS | Status: DC
  Filled 2022-10-02: qty 5

## 2022-10-02 MED ORDER — KETOROLAC TROMETHAMINE 30 MG/ML IJ SOLN
INTRAMUSCULAR | Status: DC | PRN
  Administered 2022-10-02: 30 mg via INTRAVENOUS

## 2022-10-02 MED ORDER — FENTANYL CITRATE (PF) 100 MCG/2ML IJ SOLN
INTRAMUSCULAR | Status: DC | PRN
  Administered 2022-10-02 (×4): 50 ug via INTRAVENOUS

## 2022-10-02 MED ORDER — OXYCODONE HCL 5 MG/5ML PO SOLN: 5.0000 mg | Freq: Once | ORAL | Status: DC | PRN

## 2022-10-02 MED ORDER — BUPIVACAINE LIPOSOME 1.3 % IJ SUSP
INTRAMUSCULAR | Status: AC
  Filled 2022-10-02: qty 20

## 2022-10-02 MED ORDER — ACETAMINOPHEN 10 MG/ML IV SOLN
INTRAVENOUS | Status: AC
  Filled 2022-10-02: qty 100

## 2022-10-02 MED ORDER — DULOXETINE HCL 30 MG PO CPEP
60.0000 mg | ORAL_CAPSULE | Freq: Two times a day (BID) | ORAL | Status: DC
  Administered 2022-10-02 – 2022-10-05 (×6): 60 mg via ORAL
  Filled 2022-10-02 (×6): qty 2

## 2022-10-02 MED ORDER — VANCOMYCIN HCL IN DEXTROSE 1-5 GM/200ML-% IV SOLN
INTRAVENOUS | Status: AC
  Administered 2022-10-02: 1000 mg via INTRAVENOUS
  Filled 2022-10-02: qty 200

## 2022-10-02 MED ORDER — OXYCODONE HCL 5 MG PO TABS: 5.0000 mg | ORAL_TABLET | Freq: Once | ORAL | Status: DC | PRN

## 2022-10-02 MED ORDER — SUGAMMADEX SODIUM 200 MG/2ML IV SOLN
INTRAVENOUS | Status: DC | PRN
  Administered 2022-10-02: 200 mg via INTRAVENOUS

## 2022-10-02 MED ORDER — SODIUM CHLORIDE (PF) 0.9 % IJ SOLN
INTRAMUSCULAR | Status: DC | PRN
  Administered 2022-10-02: 60 mL via INTRAMUSCULAR

## 2022-10-02 MED ORDER — OXYCODONE HCL 5 MG PO TABS: 5.0000 mg | ORAL_TABLET | ORAL | Status: DC | PRN

## 2022-10-02 MED ORDER — BUPIVACAINE-EPINEPHRINE (PF) 0.5% -1:200000 IJ SOLN
INTRAMUSCULAR | Status: DC | PRN
  Administered 2022-10-02: 10 mL via PERINEURAL

## 2022-10-02 MED ORDER — DEXAMETHASONE SODIUM PHOSPHATE 10 MG/ML IJ SOLN
INTRAMUSCULAR | Status: DC | PRN
  Administered 2022-10-02: 10 mg via INTRAVENOUS

## 2022-10-02 SURGICAL SUPPLY — 81 items
ALLOGRAFT BONE FIBER KORE 5 (Bone Implant) IMPLANT
BASIN KIT SINGLE STR (MISCELLANEOUS) ×1 IMPLANT
BUR NEURO DRILL SOFT 3.0X3.8M (BURR) ×1 IMPLANT
CAP LOCKING THREADED (Cap) IMPLANT
CHLORAPREP W/TINT 26 (MISCELLANEOUS) ×3 IMPLANT
CNTNR SPEC 2.5X3XGRAD LEK (MISCELLANEOUS) ×1
CONNECTOR DH LAT 5.5-6 12 WIDE (Connector) IMPLANT
CONT SPEC 4OZ STRL OR WHT (MISCELLANEOUS) ×1
CONTAINER SPEC 2.5X3XGRAD LEK (MISCELLANEOUS) ×1 IMPLANT
COUNTER NEEDLE 20/40 LG (NEEDLE) ×2 IMPLANT
COVERAGE SUPP BRAINLAB NG SPNE (MISCELLANEOUS) IMPLANT
COVERAGE SUPPORT SPINE BRAINLB (MISCELLANEOUS)
CUP MEDICINE 2OZ PLAST GRAD ST (MISCELLANEOUS) ×1 IMPLANT
DERMABOND ADVANCED .7 DNX12 (GAUZE/BANDAGES/DRESSINGS) ×1 IMPLANT
DRAPE 3D C-ARM OEC (DRAPES) IMPLANT
DRAPE C ARM PK CFD 31 SPINE (DRAPES) ×1 IMPLANT
DRAPE C-ARMOR (DRAPES) IMPLANT
DRAPE LAPAROTOMY 100X77 ABD (DRAPES) ×1 IMPLANT
DRAPE MICROSCOPE SPINE 48X150 (DRAPES) ×1 IMPLANT
DRAPE SCAN PATIENT (DRAPES) ×1 IMPLANT
DRAPE SURG 17X11 SM STRL (DRAPES) ×1 IMPLANT
DRSG TEGADERM 2-3/8X2-3/4 SM (GAUZE/BANDAGES/DRESSINGS) IMPLANT
ELECT CAUTERY BLADE TIP 2.5 (TIP) ×1
ELECT EZSTD 165MM 6.5IN (MISCELLANEOUS)
ELECT REM PT RETURN 9FT ADLT (ELECTROSURGICAL) ×1
ELECTRODE CAUTERY BLDE TIP 2.5 (TIP) ×2 IMPLANT
ELECTRODE EZSTD 165MM 6.5IN (MISCELLANEOUS) IMPLANT
ELECTRODE REM PT RTRN 9FT ADLT (ELECTROSURGICAL) ×1 IMPLANT
EX-PIN ORTHOLOCK NAV 4X150 (PIN) IMPLANT
FEE CVG SUPP BRAINLAB NG SPNE (MISCELLANEOUS) IMPLANT
GAUZE 4X4 16PLY ~~LOC~~+RFID DBL (SPONGE) ×1 IMPLANT
GLOVE BIOGEL PI IND STRL 8.5 (GLOVE) ×1 IMPLANT
GLOVE SURG SYN 6.5 ES PF (GLOVE) ×2 IMPLANT
GLOVE SURG SYN 6.5 PF PI (GLOVE) ×2 IMPLANT
GLOVE SURG SYN 8.5  E (GLOVE) ×3
GLOVE SURG SYN 8.5 E (GLOVE) ×3 IMPLANT
GLOVE SURG SYN 8.5 PF PI (GLOVE) ×3 IMPLANT
GLOVE SURG UNDER POLY LF SZ6.5 (GLOVE) ×1 IMPLANT
GLOVE SURG UNDER POLY LF SZ8.5 (GLOVE) ×1 IMPLANT
GOWN SRG LRG LVL 4 IMPRV REINF (GOWNS) ×1 IMPLANT
GOWN SRG XL LVL 3 NONREINFORCE (GOWNS) ×1 IMPLANT
GOWN STRL NON-REIN TWL XL LVL3 (GOWNS) ×1
GOWN STRL REIN LRG LVL4 (GOWNS) ×1
GRADUATE 1200CC STRL 31836 (MISCELLANEOUS) ×2 IMPLANT
HEMOVAC 400CC 10FR (MISCELLANEOUS) IMPLANT
HOLDER FOLEY CATH W/STRAP (MISCELLANEOUS) ×1 IMPLANT
KIT INFUSE LRG II (Orthopedic Implant) IMPLANT
KIT PREVENA INCISION MGT 13 (CANNISTER) ×1 IMPLANT
KIT SPINAL PRONEVIEW (KITS) ×1 IMPLANT
MANIFOLD NEPTUNE II (INSTRUMENTS) ×1 IMPLANT
MARKER SKIN DUAL TIP RULER LAB (MISCELLANEOUS) ×3 IMPLANT
MARKER SPHERE PSV REFLC 13MM (MARKER) ×7 IMPLANT
NDL SAFETY ECLIP 18X1.5 (MISCELLANEOUS) ×1 IMPLANT
NS IRRIG 1000ML POUR BTL (IV SOLUTION) ×1 IMPLANT
PACK LAMINECTOMY NEURO (CUSTOM PROCEDURE TRAY) ×1 IMPLANT
PAD ARMBOARD 7.5X6 YLW CONV (MISCELLANEOUS) ×1 IMPLANT
PREVENA INCISION MGT 90 150 (MISCELLANEOUS) IMPLANT
ROD HEXENDED ALLOY 5.5X500MM (Rod) IMPLANT
ROD INTEGRATED INLINE 5.5 (Rod) IMPLANT
SCREW CREO SPINAL 6.5X35 (Screw) IMPLANT
SCREW CREO SPINAL 6.5X40 (Screw) IMPLANT
SOLUTION IRRIG SURGIPHOR (IV SOLUTION) ×1 IMPLANT
STAPLER SKIN PROX 35W (STAPLE) IMPLANT
SURGIFLO W/THROMBIN 8M KIT (HEMOSTASIS) ×1 IMPLANT
SUT DVC VLOC 3-0 CL 6 P-12 (SUTURE) ×1 IMPLANT
SUT ETHILON 3-0 FS-10 30 BLK (SUTURE) ×2
SUT VIC AB 0 CT1 18XCR BRD 8 (SUTURE) IMPLANT
SUT VIC AB 0 CT1 27 (SUTURE) ×2
SUT VIC AB 0 CT1 27XCR 8 STRN (SUTURE) ×2 IMPLANT
SUT VIC AB 0 CT1 8-18 (SUTURE) ×2
SUT VIC AB 2-0 CT1 18 (SUTURE) ×2 IMPLANT
SUT VLOC 90 3-0 CLR P-12 (SUTURE) IMPLANT
SUTURE EHLN 3-0 FS-10 30 BLK (SUTURE) IMPLANT
SYR 10ML LL (SYRINGE) ×2 IMPLANT
SYR 30ML LL (SYRINGE) ×2 IMPLANT
TOWEL OR 17X26 4PK STRL BLUE (TOWEL DISPOSABLE) ×4 IMPLANT
TRAP FLUID SMOKE EVACUATOR (MISCELLANEOUS) ×1 IMPLANT
TROCAR INSERT W/PEDICLE NDL (TROCAR) IMPLANT
TROCAR INSERT W/PEDICLE NDLE (TROCAR)
TUBING CONNECTING 10 (TUBING) ×2 IMPLANT
WATER STERILE IRR 1000ML POUR (IV SOLUTION) ×2 IMPLANT

## 2022-10-02 NOTE — Transfer of Care (Signed)
Immediate Anesthesia Transfer of Care Note  Patient: Bryce Williams  Procedure(s) Performed: OPEN EXTENSION OF FUSION TO T2 (Spine Thoracic) APPLICATION OF INTRAOPERATIVE CT SCAN (Spine Thoracic)  Patient Location: PACU  Anesthesia Type:General  Level of Consciousness: awake, alert , and patient cooperative  Airway & Oxygen Therapy: Patient Spontanous Breathing and Patient connected to face mask oxygen  Post-op Assessment: Report given to RN, Post -op Vital signs reviewed and stable, and Patient moving all extremities  Post vital signs: stable  Last Vitals:  Vitals Value Taken Time  BP 179/98 10/02/22 1602  Temp 36.5 C 10/02/22 1602  Pulse 92 10/02/22 1607  Resp 10 10/02/22 1607  SpO2 100 % 10/02/22 1607  Vitals shown include unvalidated device data.  Last Pain:  Vitals:   10/02/22 1602  TempSrc:   PainSc: Asleep         Complications: No notable events documented.

## 2022-10-02 NOTE — Anesthesia Postprocedure Evaluation (Signed)
Anesthesia Post Note  Patient: Bryce Williams  Procedure(s) Performed: OPEN EXTENSION OF FUSION TO T2 (Spine Thoracic) APPLICATION OF INTRAOPERATIVE CT SCAN (Spine Thoracic)  Patient location during evaluation: PACU Anesthesia Type: General Level of consciousness: awake and alert Pain management: pain level controlled Vital Signs Assessment: post-procedure vital signs reviewed and stable Respiratory status: spontaneous breathing, nonlabored ventilation, respiratory function stable and patient connected to nasal cannula oxygen Cardiovascular status: blood pressure returned to baseline and stable Postop Assessment: no apparent nausea or vomiting Anesthetic complications: no   There were no known notable events for this encounter.   Last Vitals:  Vitals:   10/02/22 1701 10/02/22 1715  BP: (!) 146/91 (!) 146/88  Pulse: 85 88  Resp: 10 13  Temp: 37.1 C 37.1 C  SpO2: 97% 97%    Last Pain:  Vitals:   10/02/22 1715  TempSrc:   PainSc: [place]

## 2022-10-02 NOTE — Op Note (Signed)
Indications: Mr. 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   Preoperative Note:   Risks of surgery discussed include: infection, bleeding, stroke, coma, death, paralysis, CSF leak, nerve/spinal cord injury, numbness, tingling, weakness, complex regional pain syndrome, recurrent stenosis and/or disc herniation, vascular injury, development of instability, neck/back pain, need for further surgery, persistent symptoms, development of deformity, and the risks of anesthesia. The patient understood these risks and agreed to proceed.  Operative Note:  1. Extension of fusion to T2 2. Posterolateral arthrodesis T2 to T5 3. Posterior segmental instrumentation T2 to T10 4. Use of stereotaxis    The patient was brought to the Operating Room, intubated and turned into the prone position. All pressure points were checked and double checked. Flouroscopy was used to mark the incision. The patient was prepped and draped in the standard fashion. A full timeout was performed. Preoperative antibiotics were given. The incision was injected with local anesthetic.  The incision was opened with a scalpel, then the soft tissues divided with the Bovie. Self-retaining retractors were placed. The prior implants were identified from T5 to T10.  The paraspinus muscles were reflected laterally in subperiosteal fashion until the transverse processes were visible from T2 to T6.   We then placed the  stereotactic array in position and attempted to acquire stereotactic images. Unfortunately, the C arm could not be positioned in a way to acquire images.  Thus, we moved to anatomic placement of pedicle screws.  The locking caps were removed and the prior implants were found to fit very tightly.  Thus, these were left in position.    At T2 on one side, a starting point was chosen based on anatomic landmarks, then breached with a high speed drill. A pedicle finder probe was used to cannulate the pedicle, then the balltip probe used to confirm lack of breach. The tract was tapped, re-checked with the balltip probe, then a 6.5 x 35 mm pedicle screw was placed. The procedure was then repeated contralaterally and the same size screw placed.  At T3 on one side, a starting point was chosen based on anatomic landmarks, then breached with a high speed drill. A pedicle finder probe was used to cannulate the pedicle, then the balltip probe used to confirm lack of breach. The tract was tapped, re-checked with the balltip probe, then a 6.5 x 40 mm pedicle screw was placed. The procedure was then repeated contralaterally and the same size screw placed.  At T4 on one side, a starting point was chosen based on anatomic landmarks, then breached with a high speed drill. A pedicle finder probe was used to cannulate the pedicle, then the balltip probe used to confirm lack of breach. The tract was tapped, re-checked with the balltip probe, then a 6.5 x 40 mm pedicle screw was placed. The procedure was then repeated contralaterally and the same size screw placed.  Rods were measured to length, cut, and shaped. The rods were secured using locking caps to manufacturer's specifications. Final AP and lateral radiographs were  taken to confirm placement of instrumentation and appropriate alignment. The wound was copiously irrigated, then the external surfaces of the remaining lamina, facet, and transverse processes from T2 to T5 were  decorticated. A mixture of allograft and autograft was placed over the decorticated surfaces for arthrodesis. BMP was utilized.  A drain was placed subfascially and above the fascia.  After hemostasis, the wound was closed in layers with 0 and 2-0 vicryl. 3-0 monocryl and a wound vac  were applied to the incision.  The patient was then flipped supine and extubated with incident. All counts were correct times 2 at the end of the case. No immediate complications were noted.  Cooper Render PA assisted in the entire procedure. An assistant was required for this procedure due to the complexity.  The assistant provided assistance in tissue manipulation and suction, and was required for the successful and safe performance of the procedure. I performed the critical portions of the procedure.   Meade Maw MD

## 2022-10-02 NOTE — Progress Notes (Signed)
Informed family, via text, pt will be going to room 148.  VSS and pt resting quietly in bed with eyes closed.

## 2022-10-02 NOTE — Anesthesia Preprocedure Evaluation (Addendum)
Anesthesia Evaluation  Patient identified by MRN, date of birth, ID band Patient awake    Reviewed: Allergy & Precautions, NPO status , Patient's Chart, lab work & pertinent test results  History of Anesthesia Complications Negative for: history of anesthetic complications  Airway Mallampati: III   Neck ROM: Full    Dental no notable dental hx.    Pulmonary sleep apnea , former smoker (quit 2012)   Pulmonary exam normal breath sounds clear to auscultation       Cardiovascular hypertension, Normal cardiovascular exam+ dysrhythmias (a fib on Eliquis)  Rhythm:Regular Rate:Normal  ECG 09/23/22:  Sinus rhythm with Premature atrial complexes with Abberant conduction Low voltage QRS Cannot rule out Anterior infarct , age undetermined  Echo 01/27/22:  1.  Normal left ventricular size and function.  LVEF >55%.  2. Mildly enlarged right ventricle with normal systolic function.  3.  No significant valve abnormalities.  4.  No significant change compared to 05/02/2020 echocardiogram.   Holter 12/14/21:  The predominant rhythm was sinus rhythm. Maximum Heart Rate 133 bpm, Minimum Heart Rate 36 bpm, Average Heart Rate 72 bpm.  [address]20 PVCs (PVC burden 18.32%).  Two ventricular triplets. No PACs . One pause (3.8 seconds) Day 4 / 02:12:51 am. One patient triggered event associated with sinus rhythm with PVCs.    Neuro/Psych Seizures - (last sz 4 years ago),  PSYCHIATRIC DISORDERS Anxiety Depression Bipolar Disorder   Chronic pain; failed back syndrome CVA (2019, no residual deficits)    GI/Hepatic ,GERD  ,,  Endo/Other  Obesity   Renal/GU negative Renal ROS     Musculoskeletal   Abdominal   Peds  Hematology negative hematology ROS (+)   Anesthesia Other Findings Cardiology note 12/14/21:  Impression:  ICD-10-CM  1. 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 This may have been the cause of  his presumed bradycardia; it is possible that his home blood pressure cuff was not picking up his premature beats and was showing falsely low heart rates and blood pressure.  2. Shortness of breath on exertion R06.02 His shortness of breath is mild; it is possible that this could be related to premature beats.  3. Chest discomfort R07.89 His chest discomfort is transient and is unlikely to be cardiac.  4. Paroxysmal atrial fibrillation (CMS-HCC) I48.0 He has not had symptoms suggestive of recurrent atrial fibrillation. He is anticoagulated with apixaban.  5. Benign essential hypertension I10 His blood pressure is reasonably controlled today.  6. OSA (obstructive sleep apnea) G47.33 He has been unable to tolerate CPAP.   Recommendations: 1. 4-day Holter monitor to evaluate PVC burden. 2. Echocardiogram to evaluate LV function. 3. Continue current medications as listed above. 4. I asked him to contact me if he has increasing shortness of breath or if he has syncope or presyncope. 5. Follow-up visit in 1 year or sooner if problems arise.    Reproductive/Obstetrics                             Anesthesia Physical Anesthesia Plan  ASA: 3  Anesthesia Plan: General   Post-op Pain Management:    Induction: Intravenous  PONV Risk Score and Plan: 2 and Ondansetron, Dexamethasone and Treatment may vary due to age or medical condition  Airway Management Planned: Oral ETT  Additional Equipment:   Intra-op Plan:   Post-operative Plan: Extubation in OR  Informed Consent: I have reviewed  the patients History and Physical, chart, labs and discussed the procedure including the risks, benefits and alternatives for the proposed anesthesia with the patient or authorized representative who has indicated his/her understanding and acceptance.     Dental advisory given  Plan Discussed with: CRNA  Anesthesia Plan Comments: (Patient consented for risks of anesthesia including  but not limited to:  - adverse reactions to medications - damage to eyes, teeth, lips or other oral mucosa - nerve damage due to positioning  - sore throat or hoarseness - damage to heart, brain, nerves, lungs, other parts of body or loss of life  Informed patient about role of CRNA in peri- and intra-operative care.  Patient voiced understanding.)        Anesthesia Quick Evaluation

## 2022-10-02 NOTE — Anesthesia Procedure Notes (Signed)
Procedure Name: Intubation Date/Time: 10/02/2022 11:09 AM  Performed by: Patience Musca., CRNAPre-anesthesia Checklist: Patient identified, Patient being monitored, Timeout performed, Emergency Drugs available and Suction available Patient Re-evaluated:Patient Re-evaluated prior to induction Oxygen Delivery Method: Circle system utilized Preoxygenation: Pre-oxygenation with 100% oxygen Induction Type: IV induction Ventilation: Mask ventilation without difficulty Laryngoscope Size: McGraph and 4 Grade View: Grade I Tube type: Oral Tube size: 7.5 mm Number of attempts: 1 Airway Equipment and Method: Stylet Placement Confirmation: ETT inserted through vocal cords under direct vision, positive ETCO2 and breath sounds checked- equal and bilateral Secured at: 21 cm Tube secured with: Tape Dental Injury: Teeth and Oropharynx as per pre-operative assessment

## 2022-10-02 NOTE — H&P (Addendum)
REFERRING PHYSICIAN: Ricard Dillon, [address] [place], [place] [address]  DOS: T4-L3 PSF  HISTORY OF PRESENT ILLNESS:  10/02/2022  Mr. Bryce Williams presents with continued severe upper back pain   08/24/2022 Bryce Williams is status post thoracolumbar fusion.  He is still having significant pain at the top of his construct.  He is also having pain into his right shoulder blade and down to his fourth and fifth digits of his right hand.  The pain in the upper part of his construct is the biggest issue. History reviewed. No pertinent family history. Social History   Socioeconomic History   Marital status: Married    Spouse name: [name]   Number of children: Not on file   Years of education: Not on file   Highest education level: Not on file  Occupational History   Not on file  Tobacco Use   Smoking status: Former    Types: Cigarettes    Quit date: 2012    Years since quitting: 12.0   Smokeless tobacco: Never  Vaping Use   Vaping Use: Former  Substance and Sexual Activity   Alcohol use: Yes    Comment: rare occ   Drug use: Never   Sexual activity: Not on file  Other Topics Concern   Not on file  Social History Narrative   Not on file   Social Determinants of Health   Financial Resource Strain: Not on file  Food Insecurity: Not on file  Transportation Needs: Not on file  Physical Activity: Not on file  Stress: Not on file  Social Connections: Not on file  Intimate Partner Violence: Not on file   Current Meds  Medication Sig   acetaminophen (TYLENOL) 325 MG tablet Take 650 mg by mouth every 6 (six) hours as needed. 3 tablets   ARIPiprazole (ABILIFY) 20 MG tablet Take 20 mg by mouth at bedtime.   atorvastatin (LIPITOR) 40 MG tablet Take 40 mg by mouth in the morning.   celecoxib (CELEBREX) 200 MG capsule Take 200 mg by mouth 2 (two) times daily.   DULoxetine (CYMBALTA) 60 MG capsule Take 60 mg by mouth 2 (two) times daily.   hydrochlorothiazide  (HYDRODIURIL) 25 MG tablet Take 25 mg by mouth in the morning.   hydrOXYzine (ATARAX) 25 MG tablet Take 25 mg by mouth 3 (three) times daily as needed for anxiety.   levETIRAcetam (KEPPRA) 500 MG tablet Take 500 mg by mouth 2 (two) times daily.   lithium carbonate 300 MG capsule Take 300 mg by mouth daily. 2 tablets at qhs   ondansetron (ZOFRAN) 4 MG tablet Take 4 mg by mouth every 8 (eight) hours as needed for nausea or vomiting.   Oxycodone HCl 10 MG TABS Take 1 tablet (10 mg total) by mouth every 4 (four) hours as needed.   senna-docusate (SENOKOT-S) 8.6-50 MG tablet Take 1-2 tablets by mouth 2 (two) times daily as needed for mild constipation (take if no BM within 24 hours).   traZODone (DESYREL) 100 MG tablet Take 200 mg by mouth at bedtime.   Allergies  Allergen Reactions   Morphine And Related Rash   Prednisone Anxiety     PHYSICAL EXAMINATION:  Vitals:   10/02/22 0916  BP: (!) 156/94  Pulse: 71  Resp: 16  Temp: 97.9 F (36.6 C)  SpO2: 97%   Heart sounds normal no MRG. Chest Clear to Auscultation Bilaterally.  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    MRI of the cervical spine on 08/11/2022 shows moderate stenosis at C4-5 and C6-7.  He has mild stenosis at C3-4.  He has a prior anterior cervical discectomy and fusion at C5-6.  He has moderate bilateral foraminal stenosis at C3-4 and severe bilateral foraminal stenosis at C4-5 and C6-7.  CT scan of the thoracic spine on August 11, 2022 shows postsurgical changes with old compression fracture of T9 with moderate height loss.  ASSESSMENT/PLAN: Bryce Williams is doing fair after thoracolumbar fusion.  He has proximal junctional failure requiring surgical intervention. We will extend his fusion to T2.   We discussed risks and benefits previously.  Meade Maw MD, [facility] Department of Neurosurgery

## 2022-10-03 ENCOUNTER — Encounter: Payer: Self-pay | Admitting: Neurosurgery

## 2022-10-03 NOTE — Plan of Care (Signed)
  Problem: Clinical Measurements: Goal: Postoperative complications will be avoided or minimized Outcome: Progressing   Problem: Clinical Measurements: Goal: Diagnostic test results will improve Outcome: Progressing   Problem: Pain Management: Goal: Pain level will decrease Outcome: Progressing   Problem: Skin Integrity: Goal: Will show signs of wound healing Outcome: Progressing   Problem: Health Behavior/Discharge Planning: Goal: Identification of resources available to assist in meeting health care needs will improve Outcome: Progressing   Problem: Clinical Measurements: Goal: Diagnostic test results will improve Outcome: Progressing   Problem: Elimination: Goal: Will not experience complications related to bowel motility Outcome: Progressing

## 2022-10-03 NOTE — Evaluation (Signed)
Occupational Therapy Evaluation Patient Details Name: GID SCHOFFSTALL MRN: [id number] DOB: 30-Jul-1961 Today's Date: 10/03/2022   History of Present Illness 62yo male s/p extension of spinal fusion to T2 10/02/22; h/o multiple level prior spinal surgeries, most recently 11/14/21 had posterolateral arthrodesis T5-12, and posterior instrumentation T5-L3. PMHx includes acquired scoliosis, anxiety, bipolar disorder, chronic pain syndrome, DDD, depression, HLD, HTN, seizures, stroke, and sleep apnea.   Clinical Impression   Mr. Maniscalco presents with generalized weakness, limited endurance, limited ROM, and pain. During today's evaluation, he is able to perform bed mobility with HOB elevated and using bed rails, but cannot transfer supine<>sit without HOB elevated. Able to complete sit<>stand, ambulation, grooming, toileting, all with SUPV for safety, slow movements with increased effort, but no LOB, no AD. Pt demonstrates good safety awareness. Requires Max A for LB dressing. Provided educ re: spinal precautions, AE for LB dressing, bathing. Pt and spouse verbalize understanding. Pt reports 8/10 pain despite recently receiving pain medications. Recommend ongoing OT while hospitalized and DC with [facility].   Recommendations for follow up therapy are one component of a multi-disciplinary discharge planning process, led by the attending physician.  Recommendations may be updated based on patient status, additional functional criteria and insurance authorization.   Follow Up Recommendations  Home health OT     Assistance Recommended at Discharge Intermittent Supervision/Assistance  Patient can return home with the following A little help with bathing/dressing/bathroom;Assistance with cooking/housework;Assist for transportation    Functional Status Assessment  Patient has had a recent decline in their functional status and demonstrates the ability to make significant improvements in function in a reasonable and  predictable amount of time.  Equipment Recommendations  None recommended by OT    Recommendations for Other Services       Precautions / Restrictions Precautions Precautions: Fall Required Braces or Orthoses:  (no brace needed) Restrictions Weight Bearing Restrictions: No      Mobility Bed Mobility Overal bed mobility: Needs Assistance Bed Mobility: Supine to Sit, Sit to Supine     Supine to sit: Modified independent (Device/Increase time), HOB elevated Sit to supine: HOB elevated        Transfers Overall transfer level: Modified independent Equipment used: None               General transfer comment: Pt able to rise to standing with good confidence and no need for physical assist. Moves slowly, steadily      Balance Overall balance assessment: Needs assistance Sitting-balance support: No upper extremity supported Sitting balance-Leahy Scale: Normal     Standing balance support: No upper extremity supported, During functional activity Standing balance-Leahy Scale: Good                             ADL either performed or assessed with clinical judgement   ADL Overall ADL's : Needs assistance/impaired     Grooming: Wash/dry hands;Oral care;Standing;Modified independent               Lower Body Dressing: Maximal assistance Lower Body Dressing Details (indicate cue type and reason): donning socks Toilet Transfer: Supervision/safety;Comfort height toilet;Stand-pivot Toilet Transfer Details (indicate cue type and reason): SUPV for safety Toileting- Clothing Manipulation and Hygiene: Supervision/safety               Vision         Perception     Praxis      Pertinent Vitals/Pain Pain Assessment Pain Assessment: 0-10 Pain  Score: 8  Pain Location: upper back/surgical area Pain Descriptors / Indicators: Aching, Guarding, Grimacing Pain Intervention(s): Premedicated before session, Repositioned, Limited activity within  patient's tolerance     Hand Dominance Left   Extremity/Trunk Assessment Upper Extremity Assessment Upper Extremity Assessment: Overall WFL for tasks assessed   Lower Extremity Assessment Lower Extremity Assessment: Overall WFL for tasks assessed       Communication Communication Communication: No difficulties   Cognition Arousal/Alertness: Awake/alert Behavior During Therapy: WFL for tasks assessed/performed Overall Cognitive Status: Within Functional Limits for tasks assessed                                       General Comments  Pt reports knowledge of spinal protective precautions, etc, good effort and motivation t/o    Exercises Other Exercises Other Exercises: Educ re: spinal precautions, DC recs   Shoulder Instructions      Home Living Family/patient expects to be discharged to:: Private residence Living Arrangements: Spouse/significant other Available Help at Discharge: Family;Available 24 hours/day Type of Home: House Home Access: Stairs to enter [employer] of Steps: 6 Entrance Stairs-Rails: Left;Right Home Layout: One level     Bathroom Shower/Tub: Occupational psychologist: Handicapped height Bathroom Accessibility: Yes   Home Equipment: Conservation officer, nature (2 wheels);Cane - single point;Grab bars - tub/shower;Adaptive equipment Adaptive Equipment: Reacher;Sock aid        Prior Functioning/Environment Prior Level of Function : Independent/Modified Independent             Mobility Comments: no AD for household distances, occasional SPC for community distances. Apparently he had worked up to walking 1-2 miles since surgery 11 months ago, more limited recently with much decreased tolerance. ADLs Comments: Indep with most basic ADL, med mgt. Sleeps in recliner because unable to get into/out of bed unless HOB is elevated. Unable to don socks, wears only slip-on shoes. Very limited, short-distance driving. Spouse does  majority of cooking and cleaning        OT Problem List: Decreased strength;Decreased range of motion;Decreased activity tolerance;Impaired balance (sitting and/or standing);Pain      OT Treatment/Interventions: Self-care/ADL training;Therapeutic exercise;Patient/family education;Balance training;Energy conservation;Therapeutic activities;DME and/or AE instruction    OT Goals(Current goals can be found in the care plan section) Acute Rehab OT Goals Patient Stated Goal: to be able to sleep in bed rather than recliner OT Goal Formulation: With patient Time For Goal Achievement: 10/17/22 Potential to Achieve Goals: Good ADL Goals Pt Will Perform Lower Body Dressing: with modified independence;sit to/from stand Pt Will Transfer to Toilet: with modified independence;ambulating;stand pivot transfer Additional ADL Goal #1: Pt will be able to transfer into/out of bed with Mod I w/o HOB elevated  OT Frequency: Min 2X/week    Co-evaluation              AM-PAC OT "6 Clicks" Daily Activity     Outcome Measure Help from another person eating meals?: None Help from another person taking care of personal grooming?: A Little Help from another person toileting, which includes using toliet, bedpan, or urinal?: A Little Help from another person bathing (including washing, rinsing, drying)?: A Little Help from another person to put on and taking off regular upper body clothing?: A Little Help from another person to put on and taking off regular lower body clothing?: A Lot 6 Click Score: 18   End of Session  Activity Tolerance: Patient tolerated treatment well Patient left: in bed;with family/visitor present;with call bell/phone within reach  OT Visit Diagnosis: Muscle weakness (generalized) (M62.81);Pain;Other abnormalities of gait and mobility (R26.89)                Time: 5956-3875 OT Time Calculation (min): 27 min Charges:  OT General Charges $OT Visit: 1 Visit OT Evaluation $OT  Eval Low Complexity: 1 Low OT Treatments $Self Care/Home Management : 23-37 mins Latina Craver, PhD, MS, OTR/L 10/03/22, 2:57 PM

## 2022-10-03 NOTE — TOC Progression Note (Addendum)
Transition of Care [facility]) - Progression Note    Patient Details  Name: AVISH TORRY MRN: [id number] Date of Birth: 06-17-1961  Transition of Care [facility]) CM/SW [place], RN Phone Number: 10/03/2022, 11:55 AM  Clinical Narrative:     The patient is not in the service area for [facility], [facility], I am checking to see if [facility] goes to his area, they do have a [place] location that can service the patient's needs, PT is to evaluate and make recommendations, TOC to follow and assist with needs        Expected Discharge Plan and Services                                               Social Determinants of Health (SDOH) Interventions SDOH Screenings   Food Insecurity: No Food Insecurity (10/02/2022)  Housing: Low Risk  (10/02/2022)  Transportation Needs: No Transportation Needs (10/02/2022)  Utilities: Not At Risk (10/02/2022)  Tobacco Use: Medium Risk (10/02/2022)    Readmission Risk Interventions     No data to display

## 2022-10-03 NOTE — Evaluation (Signed)
Physical Therapy Evaluation Patient Details Name: Bryce Williams MRN: [id number] DOB: Jan 17, 1961 Today's Date: 10/03/2022  History of Present Illness  61yo male s/p extension of spinal fusion to T2 10/02/22; h/o multiple level prior spinal surgeries, most recently 11/14/21 had posterolateral arthrodesis T5-12, and posterior instrumentation T5-L3. PMHx includes acquired scoliosis, anxiety, bipolar disorder, chronic pain syndrome, DDD, depression, HLD, HTN, seizures, stroke, and sleep apnea.  Clinical Impression  Pt has been through multiple spinal surgeries over the last few years, reports that despite acute pain (and not having slept much at all last night) he is feeling good about mobility, how to protect his spine and was eager to get up and see how he can do.  Ultimately he was able to rise to do mobility, prolonged bout of ambulation (mostly with little to no UE use) and negotiate up/down steps (with UEs) and showed ability to safely transition home once medically ready to go home and is cleared by neurosurgery.  He reports improved symptoms in L UE vs pre-surgery, displaying functional strength and coordination.       Recommendations for follow up therapy are one component of a multi-disciplinary discharge planning process, led by the attending physician.  Recommendations may be updated based on patient status, additional functional criteria and insurance authorization.  Follow Up Recommendations Follow physician's recommendations for discharge plan and follow up therapies      Assistance Recommended at Discharge Set up Supervision/Assistance  Patient can return home with the following  A little help with bathing/dressing/bathroom;Assistance with cooking/housework;Assist for transportation    Equipment Recommendations None recommended by PT  Recommendations for Other Services       Functional Status Assessment Patient has had a recent decline in their functional status and demonstrates the  ability to make significant improvements in function in a reasonable and predictable amount of time.     Precautions / Restrictions Precautions Precautions: Fall (no formal spinal precautions) Required Braces or Orthoses:  (no brace needed) Restrictions Weight Bearing Restrictions: No      Mobility  Bed Mobility Overal bed mobility: Modified Independent             General bed mobility comments: Pt showed good awareness and ability to maintain should/hip alignment with roll toward EOB and then using rails to push up    Transfers Overall transfer level: Modified independent Equipment used: None, 1 person hand held assist, Rolling walker (2 wheels)               General transfer comment: Pt able to rise to standing with good confidence and no need for additional assist.    Ambulation/Gait Ambulation/Gait assistance: Supervision Gait Distance (Feet): 200 Feet Assistive device: Rolling walker (2 wheels), 1 person hand held assist, None         General Gait Details: Pt was able to ambulate with good confidence and consistent speed.  Initially with walker showing consistent cadence and safety, transitioned to single UE on rail and then no UEs at all.  Each iteration he maintained safety and confidence but did have decreased speed and increased (low grade) guarding  Stairs Stairs: Yes Stairs assistance: Modified independent (Device/Increase time) Stair Management: One rail Right Number of Stairs: 6 General stair comments: Pt was able to confidently negotiate up/down steps with reciprocal strategy, appropriate use of UEs  Wheelchair Mobility    Modified Rankin (Stroke Patients Only)       Balance Overall balance assessment: Needs assistance   Sitting balance-Leahy Scale: Normal  Standing balance-Leahy Scale: Good Standing balance comment: some hesitancy with prolonged activity but no overt LOBs                             Pertinent  Vitals/Pain Pain Assessment Pain Assessment: 0-10 Pain Score: 8  Pain Location: upper back/surgical area    Home Living Family/patient expects to be discharged to:: Private residence Living Arrangements: Spouse/significant other Available Help at Discharge: Family;Available 24 hours/day Type of Home: House Home Access: Stairs to enter Entrance Stairs-Rails: Left;Right (wide) Entrance Stairs-Number of Steps: 6   Home Layout: One level Home Equipment: Conservation officer, nature (2 wheels);Cane - single point;Grab bars - tub/shower;Adaptive equipment      Prior Function Prior Level of Function : Driving;Independent/Modified Independent             Mobility Comments: no AD for household distances, occasional SPC for community distances. Apparently he had worked up to walking 1-2 miles since surgery 11 months ago, more limited recently with much decreased tolerance. ADLs Comments: indep with basic ADL, med mgt, driving, and spouse does majority of cooking and cleaning     Hand Dominance   Dominant Hand: Left    Extremity/Trunk Assessment   Upper Extremity Assessment Upper Extremity Assessment: Overall WFL for tasks assessed (L UE strength WFL, reports symptoms have improved with surgery)    Lower Extremity Assessment Lower Extremity Assessment: Overall WFL for tasks assessed       Communication   Communication: No difficulties  Cognition Arousal/Alertness: Awake/alert Behavior During Therapy: WFL for tasks assessed/performed Overall Cognitive Status: Within Functional Limits for tasks assessed                                          General Comments General comments (skin integrity, edema, etc.): Pt reports knowledge of spinal protective precautions, etc, good effort and motivation t/o    Exercises     Assessment/Plan    PT Assessment Patient needs continued PT services  PT Problem List Decreased strength;Decreased activity tolerance;Decreased  balance;Decreased knowledge of precautions;Pain;Decreased mobility;Decreased knowledge of use of DME;Decreased safety awareness       PT Treatment Interventions DME instruction;Gait training;Functional mobility training;Stair training;Therapeutic activities;Therapeutic exercise;Balance training;Neuromuscular re-education;Patient/family education    PT Goals (Current goals can be found in the Care Plan section)  Acute Rehab PT Goals Patient Stated Goal: go home PT Goal Formulation: With patient Time For Goal Achievement: 10/16/22 Potential to Achieve Goals: Good    Frequency 7X/week     Co-evaluation               AM-PAC PT "6 Clicks" Mobility  Outcome Measure Help needed turning from your back to your side while in a flat bed without using bedrails?: None Help needed moving from lying on your back to sitting on the side of a flat bed without using bedrails?: None Help needed moving to and from a bed to a chair (including a wheelchair)?: None Help needed standing up from a chair using your arms (e.g., wheelchair or bedside chair)?: None Help needed to walk in hospital room?: None Help needed climbing 3-5 steps with a railing? : A Little 6 Click Score: 23    End of Session Equipment Utilized During Treatment: Gait belt Activity Tolerance: Patient limited by pain;Patient tolerated treatment well Patient left: with chair alarm set;with call bell/phone within  reach;with family/visitor present Nurse Communication: Mobility status PT Visit Diagnosis: Unsteadiness on feet (R26.81);Muscle weakness (generalized) (M62.81);Difficulty in walking, not elsewhere classified (R26.2);Pain Pain - Right/Left: Left Pain - part of body: Arm (cervicalgia)    Time: 3009-2330 PT Time Calculation (min) (ACUTE ONLY): 29 min   Charges:   PT Evaluation $PT Eval Low Complexity: 1 Low PT Treatments $Gait Training: 8-22 mins        Kreg Shropshire, DPT 10/03/2022, 12:51 PM

## 2022-10-03 NOTE — Plan of Care (Signed)
Patient sleeping between care. Aox4. Frequent request for pain medication overnight. Wound vac and hemovacs intact. Plan of care reviewed with patient and spouse. Call bell within reach.   Problem: Education: Goal: Ability to verbalize activity precautions or restrictions will improve Outcome: Progressing Goal: Knowledge of the prescribed therapeutic regimen will improve Outcome: Progressing Goal: Understanding of discharge needs will improve Outcome: Progressing   Problem: Activity: Goal: Ability to avoid complications of mobility impairment will improve Outcome: Progressing Goal: Ability to tolerate increased activity will improve Outcome: Progressing Goal: Will remain free from falls Outcome: Progressing   Problem: Bowel/Gastric: Goal: Gastrointestinal status for postoperative course will improve Outcome: Progressing   Problem: Clinical Measurements: Goal: Ability to maintain clinical measurements within normal limits will improve Outcome: Progressing Goal: Postoperative complications will be avoided or minimized Outcome: Progressing Goal: Diagnostic test results will improve Outcome: Progressing   Problem: Pain Management: Goal: Pain level will decrease Outcome: Progressing   Problem: Skin Integrity: Goal: Will show signs of wound healing Outcome: Progressing   Problem: Health Behavior/Discharge Planning: Goal: Identification of resources available to assist in meeting health care needs will improve Outcome: Progressing   Problem: Bladder/Genitourinary: Goal: Urinary functional status for postoperative course will improve Outcome: Progressing   Problem: Education: Goal: Knowledge of General Education information will improve Description: Including pain rating scale, medication(s)/side effects and non-pharmacologic comfort measures Outcome: Progressing   Problem: Health Behavior/Discharge Planning: Goal: Ability to manage health-related needs will  improve Outcome: Progressing   Problem: Clinical Measurements: Goal: Ability to maintain clinical measurements within normal limits will improve Outcome: Progressing Goal: Will remain free from infection Outcome: Progressing Goal: Diagnostic test results will improve Outcome: Progressing Goal: Respiratory complications will improve Outcome: Progressing Goal: Cardiovascular complication will be avoided Outcome: Progressing   Problem: Activity: Goal: Risk for activity intolerance will decrease Outcome: Progressing   Problem: Nutrition: Goal: Adequate nutrition will be maintained Outcome: Progressing   Problem: Coping: Goal: Level of anxiety will decrease Outcome: Progressing   Problem: Elimination: Goal: Will not experience complications related to bowel motility Outcome: Progressing Goal: Will not experience complications related to urinary retention Outcome: Progressing   Problem: Pain Managment: Goal: General experience of comfort will improve Outcome: Progressing   Problem: Safety: Goal: Ability to remain free from injury will improve Outcome: Progressing   Problem: Skin Integrity: Goal: Risk for impaired skin integrity will decrease Outcome: Progressing

## 2022-10-03 NOTE — Discharge Instructions (Addendum)
Your surgeon has performed an operation on your spine. Many times, patients feel better immediately after surgery and can "overdo it." Even if you feel well, it is important that you follow these activity guidelines. If you do not let your back heal properly from the surgery, you can increase the chance of a disc herniation and/or return of your symptoms. The following are instructions to help in your recovery once you have been discharged from the hospital.  *Resume Elliquis per pre-op instructions   Activity    No bending, lifting, or twisting ("BLT"). Avoid lifting objects heavier than 10 pounds (gallon milk jug).  Where possible, avoid household activities that involve lifting, bending, pushing, or pulling such as laundry, vacuuming, grocery shopping, and childcare. Try to arrange for help from friends and family for these activities while your back heals.  Increase physical activity slowly as tolerated.  Taking short walks is encouraged, but avoid strenuous exercise. Do not jog, run, bicycle, lift weights, or participate in any other exercises unless specifically allowed by your doctor. Avoid prolonged sitting, including car rides.  Talk to your doctor before resuming sexual activity.  You should not drive until cleared by your doctor.  Until released by your doctor, you should not return to work or school.  You should rest at home and let your body heal.   You may shower two days after your surgery.  After showering, lightly dab your incision dry. Do not take a tub bath or go swimming for 3 weeks, or until approved by your doctor at your follow-up appointment.  If you smoke, we strongly recommend that you quit.  Smoking has been proven to interfere with normal healing in your back and will dramatically reduce the success rate of your surgery. Please contact [facility] (800-QUIT-NOW) and use the resources at www.[facility].com for assistance in stopping smoking.  Surgical Incision   If  you have a dressing on your incision, you may remove it three days after your surgery. Keep your incision area clean and dry.  If you have staples or stitches on your incision, you should have a follow up scheduled for removal. If you do not have staples or stitches, you will have steri-strips (small pieces of surgical tape) or Dermabond glue. The steri-strips/glue should begin to peel away within about a week (it is fine if the steri-strips fall off before then). If the strips are still in place one week after your surgery, you may gently remove them.  Diet            You may return to your usual diet. Be sure to stay hydrated.  When to Contact us  Although your surgery and recovery will likely be uneventful, you may have some residual numbness, aches, and pains in your back and/or legs. This is normal and should improve in the next few weeks.  However, should you experience any of the following, contact us immediately: New numbness or weakness Pain that is progressively getting worse, and is not relieved by your pain medications or rest Bleeding, redness, swelling, pain, or drainage from surgical incision Chills or flu-like symptoms Fever greater than 101.0 F (38.3 C) Problems with bowel or bladder functions Difficulty breathing or shortness of breath Warmth, tenderness, or swelling in your calf  Contact Information During office hours (Monday-Friday 9 am to 5 pm), please call your physician at [phone] and ask for Berdine Addison After hours and weekends, please call [phone] and speak with the neurosurgeon on call For a life-threatening  emergency, call 911

## 2022-10-03 NOTE — Progress Notes (Signed)
Attending Progress Note  History: Bryce Williams is s/p extension of fusion to T2 for thoracic adjacent segment disease  POD1: Pt reports under controlled back pain overnight.  Feels as though he is doing better this morning but continues to have pain in his back with movement.  He denies any lower extremity symptoms  Physical Exam: Vitals:   10/03/22 0600 10/03/22 0722  BP: 135/82 129/81  Pulse: 99 92  Resp: 18 17  Temp: 97.7 F (36.5 C) 98.3 F (36.8 C)  SpO2: 97% 94%    AA Ox3 CNI  Strength:5/5 throughout BLE  Incision covered with incisional wound VAC Left HV 69ml output. 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

## 2022-10-04 LAB — GLUCOSE, CAPILLARY: Glucose-Capillary: 134 mg/dL — ABNORMAL HIGH (ref 70–99)

## 2022-10-04 LAB — BASIC METABOLIC PANEL
Anion gap: 10 (ref 5–15)
BUN: 10 mg/dL (ref 8–23)
CO2: 27 mmol/L (ref 22–32)
Calcium: 8.5 mg/dL — ABNORMAL LOW (ref 8.9–10.3)
Chloride: 96 mmol/L — ABNORMAL LOW (ref 98–111)
Creatinine, Ser: 0.85 mg/dL (ref 0.61–1.24)
GFR, Estimated: >60 mL/min (ref >60)
Glucose, Bld: 126 mg/dL — ABNORMAL HIGH (ref 70–99)
Potassium: 3.3 mmol/L — ABNORMAL LOW (ref 3.5–5.1)
Sodium: 133 mmol/L — ABNORMAL LOW (ref 135–145)

## 2022-10-04 LAB — CBC
HCT: 34.2 % — ABNORMAL LOW (ref 39.0–52.0)
Hemoglobin: 10.8 g/dL — ABNORMAL LOW (ref 13.0–17.0)
MCH: 21.4 pg — ABNORMAL LOW (ref 26.0–34.0)
MCHC: 31.6 g/dL (ref 30.0–36.0)
MCV: 67.7 fL — ABNORMAL LOW (ref 80.0–100.0)
Platelets: 182 10*3/uL (ref 150–400)
RBC: 5.05 MIL/uL (ref 4.22–5.81)
RDW: 15.7 % — ABNORMAL HIGH (ref 11.5–15.5)
WBC: 14.6 10*3/uL — ABNORMAL HIGH (ref 4.0–10.5)
nRBC: 0 % (ref 0.0–0.2)

## 2022-10-04 MED ORDER — ACETAMINOPHEN 500 MG PO TABS
1000.0000 mg | ORAL_TABLET | Freq: Four times a day (QID) | ORAL | Status: DC | PRN
  Administered 2022-10-04 – 2022-10-05 (×2): 1000 mg via ORAL
  Filled 2022-10-04 (×2): qty 2

## 2022-10-04 MED ORDER — HYDROMORPHONE HCL 1 MG/ML IJ SOLN
0.5000 mg | INTRAMUSCULAR | Status: DC | PRN
  Administered 2022-10-05: 0.5 mg via INTRAVENOUS
  Filled 2022-10-04: qty 1

## 2022-10-04 NOTE — Progress Notes (Signed)
Physical Therapy Treatment Patient Details Name: Bryce Williams MRN: [id number] DOB: August 23, 1961 Today's Date: 10/04/2022   History of Present Illness 62yo male s/p extension of spinal fusion to T2 10/02/22; h/o multiple level prior spinal surgeries, most recently 11/14/21 had posterolateral arthrodesis T5-12, and posterior instrumentation T5-L3. PMHx includes acquired scoliosis, anxiety, bipolar disorder, chronic pain syndrome, DDD, depression, HLD, HTN, seizures, stroke, and sleep apnea.    PT Comments    Pt reports pain is fine at rest, but still in the 6/10 range - some increase with activity but not limiting activity.  He was able to do ~400 ft of ambulation w/o AD, negotiate up/down steps w/o issue and generally did well.  He did have some fatigue with HR getting into the 120s by the end, O2 hovering near 90% (+/-) t/o ambulation on room air, no SOB.     Recommendations for follow up therapy are one component of a multi-disciplinary discharge planning process, led by the attending physician.  Recommendations may be updated based on patient status, additional functional criteria and insurance authorization.  Follow Up Recommendations  Follow physician's recommendations for discharge plan and follow up therapies     Assistance Recommended at Discharge Set up Supervision/Assistance  Patient can return home with the following A little help with bathing/dressing/bathroom;Assistance with cooking/housework;Assist for transportation   Equipment Recommendations  None recommended by PT    Recommendations for Other Services       Precautions / Restrictions Precautions Precautions: Fall Precaution Comments: no formal spinal precuations, protective measures reviewed Restrictions Weight Bearing Restrictions: No     Mobility  Bed Mobility               General bed mobility comments: in recliner pre/post session    Transfers   Equipment used: None               General  transfer comment: Pt able to rise to standing with  no need for physical assist. Moves slowly, steadily with some guarding    Ambulation/Gait Ambulation/Gait assistance: Supervision Gait Distance (Feet): 400 Feet Assistive device: None         General Gait Details: Pt was able to ambulate with good confidence and slow but consistent speed w/o need for AD.  Each iteration he maintained safety and confidence but did have decreased speed and increased (low grade) guarding   Stairs Stairs: Yes Stairs assistance: Modified independent (Device/Increase time) Stair Management: One rail Right Number of Stairs: 6 General stair comments: Pt self selected step-to strategy this date, safe and appropriate negotiation   Wheelchair Mobility    Modified Rankin (Stroke Patients Only)       Balance Overall balance assessment: Needs assistance Sitting-balance support: No upper extremity supported Sitting balance-Leahy Scale: Normal     Standing balance support: No upper extremity supported, During functional activity Standing balance-Leahy Scale: Good Standing balance comment: minimal guarding/hesitancy with prolonged standing, no  stagger stepping or overt LOBs                            Cognition Arousal/Alertness: Awake/alert Behavior During Therapy: WFL for tasks assessed/performed Overall Cognitive Status: Within Functional Limits for tasks assessed                                          Exercises  General Comments General comments (skin integrity, edema, etc.): Pt's HR did stay in the 110s t/o most of ambulation, up to 120s by the end of ambulation, O2 did stay in the 88-93% range on room air t/o the      Pertinent Vitals/Pain Pain Assessment Pain Assessment: 0-10 Pain Score: 6  Pain Location: upper back/surgical area    Home Living                          Prior Function            PT Goals (current goals can now be  found in the care plan section) Progress towards PT goals: Progressing toward goals    Frequency    7X/week      PT Plan Current plan remains appropriate    Co-evaluation              AM-PAC PT "6 Clicks" Mobility   Outcome Measure  Help needed turning from your back to your side while in a flat bed without using bedrails?: None Help needed moving from lying on your back to sitting on the side of a flat bed without using bedrails?: None Help needed moving to and from a bed to a chair (including a wheelchair)?: None Help needed standing up from a chair using your arms (e.g., wheelchair or bedside chair)?: None Help needed to walk in hospital room?: None Help needed climbing 3-5 steps with a railing? : None 6 Click Score: 24    End of Session Equipment Utilized During Treatment: Gait belt Activity Tolerance: Patient limited by pain;Patient tolerated treatment well Patient left: with chair alarm set;with call bell/phone within reach;with family/visitor present Nurse Communication: Mobility status PT Visit Diagnosis: Unsteadiness on feet (R26.81);Muscle weakness (generalized) (M62.81);Difficulty in walking, not elsewhere classified (R26.2);Pain Pain - Right/Left: Left Pain - part of body: Arm (cervicalgia)     Time: 4970-2637 PT Time Calculation (min) (ACUTE ONLY): 12 min  Charges:  $Therapeutic Exercise: 8-22 mins                     Kreg Shropshire, DPT 10/04/2022, 4:58 PM

## 2022-10-04 NOTE — Progress Notes (Addendum)
Attending Progress Note  History: Bryce Williams is s/p extension of fusion to T2 for thoracic adjacent segment disease  POD2: Continued back pain.   POD1: Pt reports under controlled back pain overnight.  Feels as though he is doing better this morning but continues to have pain in his back with movement.  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  Cooper Render PA-C Department of Neurosurgery

## 2022-10-04 NOTE — Progress Notes (Signed)
OT Cancellation Note  Patient Details Name: Bryce Williams MRN: [id number] DOB: July 07, 1961   Cancelled Treatment:    Reason Eval/Treat Not Completed: Medical issues which prohibited therapy. Upon arrival, Nursing was in, and assessing the Pt. nurse reports Pt. Became diaphoretic. Pt. Currently sitting at the EOB eating. Nursing requesting to return at a later time. Will continue to monitor, and intervene for OT treatment when appropriate.  Harrel Carina, MS, OTR/L  Harrel Carina 10/04/2022, 1:47 PM

## 2022-10-04 NOTE — TOC Progression Note (Signed)
Transition of Care [facility]) - Progression Note    Patient Details  Name: Bryce Williams MRN: [id number] Date of Birth: 1961/07/14  Transition of Care [facility]) CM/SW [place], RN Phone Number: 10/04/2022, 10:08 AM  Clinical Narrative:   [facility] accepted the patient for [facility] services, I spoke with the patient's wife and she stated that they would love to have them I notified [place] at [place], [place] orders to be placed, no DME needed his wife will transport    Expected Discharge Plan: [place] Barriers to Discharge: No Barriers Identified  Expected Discharge Plan and Services   Discharge Planning Services: CM Consult   Living arrangements for the past 2 months: Single Family Home                 DME Arranged: N/A DME Agency: NA       HH Arranged: PT, OT HH Agency: [place] Date [place]: 10/03/22 Time [place]: 1008 Representative spoke with at [place] Choice: [place]   Social Determinants of Health ([place]) Interventions SDOH Screenings   Food Insecurity: No Food Insecurity (10/02/2022)  Housing: Low Risk  (10/02/2022)  Transportation Needs: No Transportation Needs (10/02/2022)  Utilities: Not At Risk (10/02/2022)  Tobacco Use: Medium Risk (10/03/2022)    Readmission Risk Interventions     No data to display

## 2022-10-05 ENCOUNTER — Encounter: Payer: Self-pay | Admitting: Neurosurgery

## 2022-10-05 MED ORDER — SENNA 8.6 MG PO TABS: 1.0000 | ORAL_TABLET | Freq: Two times a day (BID) | ORAL | 0 refills | Status: AC

## 2022-10-05 MED ORDER — OXYCODONE-ACETAMINOPHEN 10-325 MG PO TABS: 1.0000 | ORAL_TABLET | ORAL | 0 refills | Status: AC | PRN

## 2022-10-05 MED ORDER — METHOCARBAMOL 1000 MG PO TABS: 1000.0000 mg | ORAL_TABLET | Freq: Three times a day (TID) | ORAL | 0 refills | Status: DC

## 2022-10-05 NOTE — Care Management Important Message (Signed)
Important Message  Patient Details  Name: Bryce Williams MRN: [id number] Date of Birth: Aug 18, 1961   Medicare Important Message Given:  N/A - LOS <3 / Initial given by admissions     Juliann Pulse A Tniyah Nakagawa 10/05/2022, 7:44 AM

## 2022-10-05 NOTE — Progress Notes (Signed)
Attending Progress Note  History: KENDRICKS REAP is s/p extension of fusion to T2 for thoracic adjacent segment disease  POD3: NAEO. Pt reports improved pain control this morning  POD2: Continued back pain.   POD1: Pt reports under controlled back pain overnight.  Feels as though he is doing better this morning but continues to have pain in his back with movement.  He denies any lower extremity symptoms  Physical Exam: Vitals:   10/04/22 2356 10/05/22 0756  BP: (!) 143/85 (!) 151/88  Pulse: 95 87  Resp: 20 20  Temp: 99.4 F (37.4 C) 98.2 F (36.8 C)  SpO2: 93% 97%    AA Ox3 CNI  Strength:5/5 throughout BLE  Incision covered with incisional wound VAC Left HV 40 overnight Right HV 85 overnight   Data:  Other tests/results: none   Assessment/Plan:  PARISH DUBOSE is a 62 y.o presenting with thoracic adjacent segment disease status post extension of fusion to T2  - mobilize - pain control - DVT prophylaxis - PTOT -drains removed this morning and wound redressed - dispo planning underway. [place] set up yesterday  Cooper Render PA-C Department of Neurosurgery

## 2022-10-05 NOTE — Progress Notes (Signed)
DISCHARGE NOTE:  Pt given discharge instructions, pt verbalized understanding. Pt wheeled to car by staff, wife providing transportation.

## 2022-10-05 NOTE — TOC Progression Note (Signed)
Transition of Care [facility]) - Progression Note    Patient Details  Name: Bryce Williams MRN: [id number] Date of Birth: 03-17-1961  Transition of Care [facility]) CM/SW [place], RN Phone Number: 10/05/2022, 9:36 AM  Clinical Narrative:     He is set up for [facility] and has DME, Anticipate DC today  Expected Discharge Plan: [place] Barriers to Discharge: No Barriers Identified  Expected Discharge Plan and Services   Discharge Planning Services: CM Consult   Living arrangements for the past 2 months: Single Family Home                 DME Arranged: N/A DME Agency: NA       HH Arranged: PT, OT HH Agency: [place] Date [place]: 10/03/22 Time [place]: 1008 Representative spoke with at [place]: [place]   Social Determinants of Health ([place]) Interventions [place]: No Food Insecurity (10/02/2022)  Housing: Low Risk  (10/02/2022)  Transportation Needs: No Transportation Needs (10/02/2022)  Utilities: Not At Risk (10/02/2022)  Tobacco Use: Medium Risk (10/03/2022)    Readmission Risk Interventions     No data to display

## 2022-10-05 NOTE — Discharge Summary (Signed)
Physician Discharge Summary  Patient ID: WAQAS BRUHL MRN: [id number] DOB/AGE: 03/22/1961 62 y.o.  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  His intraoperative course was uncomplicated and he was admitted for pain control, therapy evaluation, and drain output monitoring.  His hospital course was complicated by refractory pain but improved with medication changes.  He was seen and evaluated by therapy and deemed appropriate for discharge home with home health services on postop day 3.  His drains were removed and his incisional wound VAC was replaced with a clean dressing.  He was discharged with pain medication, muscle relaxer, and stool softener.  Consults: None  Significant Diagnostic Studies: none  Treatments: surgery: As above.  Please see separately dictated operative report for further details  Discharge Exam: Blood pressure (!) 151/88, pulse 87, temperature 98.2 F (36.8 C), resp. rate 20, height 5\' 8"  (1.727 m), weight 115.7 kg, SpO2 97 %. AA Ox3 CNI   Strength:5/5 throughout BLE  Incision with clean dressing in place  Disposition: Discharge disposition: 01-Home or Self Care       Discharge Instructions     Incentive spirometry RT   Complete by: As directed    Remove dressing in 24 hours   Complete by: As directed       Allergies as of 10/05/2022       Reactions   Morphine And Related Rash   Prednisone Anxiety        Medication List     STOP taking these  medications    apixaban 5 MG Tabs tablet Commonly known as: ELIQUIS   Oxycodone HCl 10 MG Tabs       TAKE these medications    acetaminophen 325 MG tablet Commonly known as: TYLENOL Take 650 mg by mouth every 6 (six) hours as needed. 3 tablets   ARIPiprazole 20 MG tablet Commonly known as: ABILIFY Take 20 mg by mouth at bedtime.   atorvastatin 40 MG tablet Commonly known as: LIPITOR Take 40 mg by mouth in the morning.   celecoxib 200 MG capsule Commonly known as: CELEBREX Take 200 mg by mouth 2 (two) times daily.   DULoxetine 60 MG capsule Commonly known as: CYMBALTA Take 60 mg by mouth 2 (two) times daily.   hydrochlorothiazide 25 MG tablet Commonly known as: HYDRODIURIL Take 25 mg by mouth in the morning.   hydrOXYzine 25 MG tablet Commonly known as: ATARAX Take 25 mg by mouth 3 (three) times daily as needed for anxiety.   levETIRAcetam 500 MG tablet Commonly known as: KEPPRA Take 500 mg by mouth 2 (two) times daily.   lithium carbonate 300 MG capsule Take 300 mg by mouth daily. 2 tablets at qhs   LORazepam 0.5 MG tablet Commonly known as: ATIVAN Take 0.5 mg by mouth 3 (three) times daily as needed for anxiety.   Methocarbamol 1000 MG Tabs Take 1,000 mg by mouth 3 (three) times daily.   naloxone HCl 4 MG/10ML Soln injection Commonly known as: NARCAN Inject into the vein as needed.   ondansetron  4 MG tablet Commonly known as: ZOFRAN Take 4 mg by mouth every 8 (eight) hours as needed for nausea or vomiting.   oxyCODONE-acetaminophen 10-325 MG tablet Commonly known as: Percocet Take 1 tablet by mouth every 3 (three) hours as needed for up to 5 days for pain.   propranolol 20 MG tablet Commonly known as: INDERAL Take 10-20 mg by mouth 2 (two) times daily as needed (anxiety or restlessness).   senna 8.6 MG Tabs tablet Commonly known as: SENOKOT Take 1 tablet (8.6 mg total) by mouth 2 (two) times daily.   senna-docusate 8.6-50 MG tablet Commonly  known as: Senokot-S Take 1-2 tablets by mouth 2 (two) times daily as needed for mild constipation (take if no BM within 24 hours).   traZODone 100 MG tablet Commonly known as: DESYREL Take 200 mg by mouth at bedtime.        Follow-up Information     Loleta Dicker, PA Follow up on 10/17/2022.   Specialty: Neurosurgery Why: at [address] information: [address] [place] [address] [phone]                 Signed: Loleta Dicker 10/05/2022, 12:29 PM

## 2022-10-05 NOTE — Plan of Care (Signed)
  Problem: Pain Management: Goal: Pain level will decrease Outcome: Progressing   Problem: Skin Integrity: Goal: Will show signs of wound healing Outcome: Progressing   Problem: Elimination: Goal: Will not experience complications related to urinary retention Outcome: Progressing   Problem: Safety: Goal: Ability to remain free from injury will improve Outcome: Progressing

## 2022-10-05 NOTE — Plan of Care (Signed)

## 2022-10-05 NOTE — Progress Notes (Signed)
Physical Therapy Treatment Patient Details Name: CORDERIUS SARACENI MRN: [id number] DOB: [birth year] Today's Date: 10/05/2022   History of Present Illness 62yo male s/p extension of spinal fusion to T2 10/02/22; h/o multiple level prior spinal surgeries, most recently 11/14/21 had posterolateral arthrodesis T5-12, and posterior instrumentation T5-L3. PMHx includes acquired scoliosis, anxiety, bipolar disorder, chronic pain syndrome, DDD, depression, HLD, HTN, seizures, stroke, and sleep apnea.    PT Comments    Pt continues to move safely and with good confidence.  He had no LOBs and only minimal fatigue (HR to ~130 with prolonged ambulation, O2 remains mid/high 90s on room air).  Pt did repeated full STS from recliner w/o UEs.  He reports continued expected pain at surgical site that does not limit his activity tolerance.   Recommendations for follow up therapy are one component of a multi-disciplinary discharge planning process, led by the attending physician.  Recommendations may be updated based on patient status, additional functional criteria and insurance authorization.  Follow Up Recommendations  Follow physician's recommendations for discharge plan and follow up therapies     Assistance Recommended at Discharge Set up Supervision/Assistance  Patient can return home with the following A little help with bathing/dressing/bathroom;Assistance with cooking/housework;Assist for transportation   Equipment Recommendations  None recommended by PT    Recommendations for Other Services       Precautions / Restrictions Precautions Precautions: Fall Restrictions Weight Bearing Restrictions: No     Mobility  Bed Mobility Overal bed mobility: Modified Independent Bed Mobility: Rolling, Sidelying to Sit Rolling: Supervision Sidelying to sit: Supervision       General bed mobility comments: Good postural awareness and alignment, light use of rails to rise    Transfers Overall transfer  level: Modified independent Equipment used: None               General transfer comment: Increased confidence and quality of movement, minimal guarding, did not need UEs    Ambulation/Gait Ambulation/Gait assistance: Supervision Gait Distance (Feet): 500 Feet Assistive device: None         General Gait Details: Increased speed and confidence with consistent cadence.  Still minimally guarded as expected but ultimatley moving well.   Stairs             Wheelchair Mobility    Modified Rankin (Stroke Patients Only)       Balance Overall balance assessment: Modified Independent                                          Cognition Arousal/Alertness: Awake/alert Behavior During Therapy: WFL for tasks assessed/performed Overall Cognitive Status: Within Functional Limits for tasks assessed                                          Exercises General Exercises - Lower Extremity Long Arc Quad: Strengthening, 10 reps Heel Slides: Strengthening, 10 reps Hip ABduction/ADduction: Strengthening, 10 reps Hip Flexion/Marching: Strengthening, 10 reps Mini-Sqauts: Strengthening, 10 reps (full squats)    General Comments General comments (skin integrity, edema, etc.): 5x STS = 15 sec      Pertinent Vitals/Pain Pain Assessment Pain Assessment: No/denies pain Pain Score: 7     Home Living  Prior Function            PT Goals (current goals can now be found in the care plan section) Progress towards PT goals: Progressing toward goals    Frequency    7X/week      PT Plan Current plan remains appropriate    Co-evaluation              AM-PAC PT "6 Clicks" Mobility   Outcome Measure  Help needed turning from your back to your side while in a flat bed without using bedrails?: None Help needed moving from lying on your back to sitting on the side of a flat bed without using bedrails?:  None Help needed moving to and from a bed to a chair (including a wheelchair)?: None Help needed standing up from a chair using your arms (e.g., wheelchair or bedside chair)?: None Help needed to walk in hospital room?: None Help needed climbing 3-5 steps with a railing? : None 6 Click Score: 24    End of Session Equipment Utilized During Treatment: Gait belt Activity Tolerance: Patient limited by pain;Patient tolerated treatment well Patient left: with chair alarm set;with call bell/phone within reach;with family/visitor present Nurse Communication: Mobility status PT Visit Diagnosis: Unsteadiness on feet (R26.81);Muscle weakness (generalized) (M62.81);Difficulty in walking, not elsewhere classified (R26.2);Pain Pain - Right/Left: Left Pain - part of body: Arm (cervicalgia)     Time: 4540-9811 PT Time Calculation (min) (ACUTE ONLY): 27 min  Charges:  $Gait Training: 8-22 mins $Therapeutic Exercise: 8-22 mins                     Kreg Shropshire, DPT 10/05/2022, 11:23 AM

## 2022-10-05 NOTE — Progress Notes (Signed)
Occupational Therapy Treatment Patient Details Name: Bryce Williams MRN: [id number] DOB: [birth year] Today's Date: 10/05/2022   History of present illness 62yo male s/p extension of spinal fusion to T2 10/02/22; h/o multiple level prior spinal surgeries, most recently 11/14/21 had posterolateral arthrodesis T5-12, and posterior instrumentation T5-L3. PMHx includes acquired scoliosis, anxiety, bipolar disorder, chronic pain syndrome, DDD, depression, HLD, HTN, seizures, stroke, and sleep apnea.   OT comments  Bryce Williams performed well today. He was able to transfer supine<>sit INDly w/o an elevated HOB, which he states is not something he has been able to do for over a year, hence has been sleeping in a recliner at home. He now believes post-DC he will be able to return to sleeping in a bed. He was able to perform toileting w/ SUPV and elevated height toilet. Provided educ and shopping info re: AE that may make pericare easier. Pt's daughter placing order for equipment during session. Pt endorses 7/10 pain today, although states that this is his baseline. Pt hopeful that, as he recovers post-surgery and rehab, his baseline level of pain with decrease.  Continue to recommend HHOT post-DC.   Recommendations for follow up therapy are one component of a multi-disciplinary discharge planning process, led by the attending physician.  Recommendations may be updated based on patient status, additional functional criteria and insurance authorization.    Follow Up Recommendations  Home health OT     Assistance Recommended at Discharge Intermittent Supervision/Assistance  Patient can return home with the following  A little help with bathing/dressing/bathroom;Assistance with cooking/housework;Assist for transportation   Equipment Recommendations  None recommended by OT    Recommendations for Other Services      Precautions / Restrictions Precautions Precautions: Fall Precaution Comments: no formal spinal  precuations, protective measures reviewed Restrictions Weight Bearing Restrictions: No       Mobility Bed Mobility Overal bed mobility: Modified Independent Bed Mobility: Rolling, Sidelying to Sit Rolling: Modified independent (Device/Increase time) Sidelying to sit: Modified independent (Device/Increase time)       General bed mobility comments: Pt able to perform bed mobility today with HOB lowered    Transfers Overall transfer level: Modified independent Equipment used: None               General transfer comment: Increased confidence and quality of movement, minimal guarding, did not need UE support     Balance Overall balance assessment: Modified Independent Sitting-balance support: No upper extremity supported Sitting balance-Leahy Scale: Good     Standing balance support: No upper extremity supported, During functional activity Standing balance-Leahy Scale: Good Standing balance comment: minimal guarding/hesitancy with prolonged standing, no  stagger stepping or overt LOBs                           ADL either performed or assessed with clinical judgement   ADL Overall ADL's : Needs assistance/impaired     Grooming: Wash/dry hands;Oral care;Standing;Modified independent                   Toilet Transfer: Supervision/safety;Comfort height toilet;Stand-pivot Armed forces technical officer Details (indicate cue type and reason): SUPV for safety Toileting- Clothing Manipulation and Hygiene: Supervision/safety Toileting - Clothing Manipulation Details (indicate cue type and reason): Provided educ/AE for pericare mgmt     Functional mobility during ADLs: Modified independent;Supervision/safety      Extremity/Trunk Assessment Upper Extremity Assessment Upper Extremity Assessment: Overall WFL for tasks assessed   Lower Extremity Assessment Lower Extremity Assessment:  Overall WFL for tasks assessed        Vision       Perception     Praxis       Cognition Arousal/Alertness: Awake/alert Behavior During Therapy: WFL for tasks assessed/performed Overall Cognitive Status: Within Functional Limits for tasks assessed                                          Exercises Other Exercises Other Exercises: Educ re: spinal precautions, DC recs, AE for LB dressing, AE for pericare    Shoulder Instructions       General Comments 5x STS = 15 sec    Pertinent Vitals/ Pain       Pain Assessment Pain Score: 7  Pain Location: upper back/surgical area Pain Descriptors / Indicators: Aching, Guarding, Grimacing Pain Intervention(s): Repositioned, Limited activity within patient's tolerance  Home Living                                          Prior Functioning/Environment              Frequency  Min 2X/week        Progress Toward Goals  OT Goals(current goals can now be found in the care plan section)  Progress towards OT goals: Progressing toward goals  Acute Rehab OT Goals OT Goal Formulation: With patient Time For Goal Achievement: 10/17/22 Potential to Achieve Goals: Good  Plan Discharge plan remains appropriate;Frequency remains appropriate    Co-evaluation                 AM-PAC OT "6 Clicks" Daily Activity     Outcome Measure   Help from another person eating meals?: None Help from another person taking care of personal grooming?: None Help from another person toileting, which includes using toliet, bedpan, or urinal?: A Little Help from another person bathing (including washing, rinsing, drying)?: A Little Help from another person to put on and taking off regular upper body clothing?: None Help from another person to put on and taking off regular lower body clothing?: A Lot 6 Click Score: 20    End of Session    OT Visit Diagnosis: Muscle weakness (generalized) (M62.81);Pain;Other abnormalities of gait and mobility (R26.89)   Activity Tolerance Patient  tolerated treatment well   Patient Left in chair;with call bell/phone within reach;with family/visitor present   Nurse Communication          Time: 8546-2703 OT Time Calculation (min): 17 min  Charges: OT General Charges $OT Visit: 1 Visit OT Treatments $Self Care/Home Management : 8-22 mins  Josiah Lobo, PhD, MS, OTR/L 10/05/22, 11:43 AM

## 2022-10-09 ENCOUNTER — Telehealth: Payer: Self-pay

## 2022-10-09 MED ORDER — CELECOXIB 200 MG PO CAPS: 200.0000 mg | ORAL_CAPSULE | Freq: Two times a day (BID) | ORAL | 0 refills | Status: AC

## 2022-10-09 NOTE — Telephone Encounter (Signed)
I spoke with Olivia Mackie (physical therapist) while she was with the patient. He is taking percocet, but still has 16-18 percocet tabs left. He is taking methocarbamol. He hasn't taken celebrex since May, and he can't remember if it helped then.  I reminded them that he can resume Eliquis 2 weeks after surgery.  She denies warmth or drainage from the incision. They will try to send a mychart message with a picture. Temp today is 97.8.  We discussed using an ice pack with a barrier to protect the incision. We discussed adding tylenol, but not to exceed the maximum mg per day (including the mg that are in percocet).  Per discussion with Marzetta Board, we will send a 1 week supply of celebrex for him to take until he resumes his eliquis.   They will call back if this does not help.

## 2022-10-09 NOTE — Telephone Encounter (Signed)
Griselda Miner Physical therapist- concerned of his constant pain- pain medication is not helping-thoracic spinal fusion surgery Dr.Yarborough 10/02/22- not able to rest or get in a comfortable position- he is missing Celebrex-swelling along the incision but no redness-denies fever-please call patient as soon as possible- [phone] should he resume Eliquis.

## 2022-10-09 NOTE — Telephone Encounter (Signed)
See below. I agree with this plan. 1 week prescription of celebrex sent to pharmacy. He was advised to stop the celebrex when he restarts his eliquis.   He has postop appointment scheduled on 10/17/22 with Danielle. Can regroup at that time.

## 2022-10-10 ENCOUNTER — Telehealth: Payer: Self-pay

## 2022-10-10 ENCOUNTER — Other Ambulatory Visit: Payer: Self-pay | Admitting: Neurosurgery

## 2022-10-10 MED ORDER — OXYCODONE HCL 5 MG PO CAPS: 5.0000 mg | ORAL_CAPSULE | Freq: Four times a day (QID) | ORAL | 0 refills | Status: DC | PRN

## 2022-10-10 NOTE — Telephone Encounter (Signed)
Pts daughter called in while she was with Bryce Williams. They are concerned about the level of pain that he is in and wanted to know if this is normal 8 days out from surgery. I explained that this is not necessarily uncommon with the type of surgery he had. She also had questions regarding the instructions of both oxycodone prescriptions. Per Bryce Williams, I reiterated that he should take percocet every 3 hours as needed, and if his pain is uncontrolled 30-60 minutes after, he can take the additional oxycodone 5mg .

## 2022-10-10 NOTE — Telephone Encounter (Signed)
-----  Message from Ada sent at 10/10/2022  2:42 PM EST ----- Regarding: Needs RX sent to different Pharmacy The pharmacy in [facility] where the prescription for 5 mg Oxycodone was sent, is out of stock. She asked if we could send it to [facility] [place].  [address], [place], [place] [address] [phone]

## 2022-10-10 NOTE — Telephone Encounter (Addendum)
Received the following picture from Mrs Richmond via e-mail. I notified her that Marzetta Board and I reviewed and it looks great.

## 2022-10-10 NOTE — Telephone Encounter (Signed)
Patient calling back that he tried taking celebrex and tylenol through the night but that did not help. He is still experiencing pain. What else can you recommend for him?

## 2022-10-10 NOTE — Telephone Encounter (Signed)
Pt's daughter also called saying the pharmacy told them they do not have capsules but they have the tablets. Patty informed the pt's daughter that we will notify the pharmacy to switch from capsules to tablets.  Danielle spoke with the pharmacist at [facility] and they confirmed they have tablets and they will switch to tablets and fill the rx.

## 2022-10-10 NOTE — Progress Notes (Signed)
I spoke with Bryce Williams. He is still having a lot of back and incisional pain despite Percocet 10 q3, Robaxin and Celebrex as well as Tylenol. I have sent in a prescription for 5 mg Oxycodone to take PRN for pain refractory to these medications. He will keep [place] updated on how he is doing

## 2022-10-11 ENCOUNTER — Encounter: Payer: Self-pay | Admitting: Neurosurgery

## 2022-10-11 ENCOUNTER — Encounter: Payer: Medicare Other | Admitting: Orthopedic Surgery

## 2022-10-11 ENCOUNTER — Telehealth: Payer: Self-pay | Admitting: Orthopedic Surgery

## 2022-10-11 DIAGNOSIS — Z981 Arthrodesis status: Secondary | ICD-10-CM

## 2022-10-11 DIAGNOSIS — M5134 Other intervertebral disc degeneration, thoracic region: Secondary | ICD-10-CM

## 2022-10-11 MED ORDER — HYDROMORPHONE HCL 2 MG PO TABS: 2.0000 mg | ORAL_TABLET | ORAL | 0 refills | Status: DC | PRN

## 2022-10-11 NOTE — Telephone Encounter (Signed)
He called this morning in severe pain. Did not sleep last night due to pain. No relief with adding oxycodone 5 mg. Incision looks good per wife.   Reviewed with Dr. Izora Ribas. He will take oxycodone 10mg  q 4-6 hours as needed for pain (prior to surgery he was on 5 per day). Then will add diluadid 2mg  every 4 hours for breakthrough pain.   He has a few of the percocet 10 left. He will use these until he is out. Then he can take 2 of oxy IR that he picked up yesterday.   He is to continue on celebrex and robaxin as well.   PMP reviewed and is appropriate. Recent oxy IR script not on there yet.   Discussed adding medrol dose pack, but will hold off for now. Will call him tomorrow to check on him.

## 2022-10-12 ENCOUNTER — Encounter: Payer: Self-pay | Admitting: Orthopedic Surgery

## 2022-10-12 ENCOUNTER — Ambulatory Visit
Admission: RE | Admit: 2022-10-12 | Discharge: 2022-10-12 | Disposition: A | Discharge status: Home / Self Care | Payer: Medicare Other | Source: Ambulatory Visit | Attending: Orthopedic Surgery | Admitting: Orthopedic Surgery

## 2022-10-12 ENCOUNTER — Ambulatory Visit
Admission: RE | Admit: 2022-10-12 | Discharge: 2022-10-12 | Disposition: A | Discharge status: Home / Self Care | Payer: Medicare Other | Attending: Orthopedic Surgery | Admitting: Orthopedic Surgery

## 2022-10-12 ENCOUNTER — Ambulatory Visit (INDEPENDENT_AMBULATORY_CARE_PROVIDER_SITE_OTHER): Payer: Medicare Other | Admitting: Orthopedic Surgery

## 2022-10-12 VITALS — BP 140/82 | Temp 97.8°F | Ht 68.0 in | Wt 255.0 lb

## 2022-10-12 DIAGNOSIS — Z981 Arthrodesis status: Secondary | ICD-10-CM

## 2022-10-12 DIAGNOSIS — M5134 Other intervertebral disc degeneration, thoracic region: Secondary | ICD-10-CM

## 2022-10-12 DIAGNOSIS — Z09 Encounter for follow-up examination after completed treatment for conditions other than malignant neoplasm: Secondary | ICD-10-CM

## 2022-10-12 MED ORDER — OXYCODONE-ACETAMINOPHEN 10-325 MG PO TABS: 1.0000 | ORAL_TABLET | ORAL | 0 refills | Status: DC | PRN

## 2022-10-12 MED ORDER — DIAZEPAM 5 MG PO TABS: 5.0000 mg | ORAL_TABLET | Freq: Three times a day (TID) | ORAL | 0 refills | Status: AC | PRN

## 2022-10-12 MED ORDER — HYDROMORPHONE HCL 2 MG PO TABS: 2.0000 mg | ORAL_TABLET | ORAL | 0 refills | Status: DC | PRN

## 2022-10-12 MED ORDER — METHYLPREDNISOLONE 4 MG PO TBPK: ORAL_TABLET | ORAL | 0 refills | Status: DC

## 2022-10-12 NOTE — Patient Instructions (Addendum)
Take percocet 10/325 every 4 hours as needed for pain. Refill sent to pharmacy.  Dilaudid will be increased to 2-4mg  (1-2 pills) every 4 hours for breakthrough pain. Refill sent to pharmacy.  Will add valium 5mg  q 8 hours as needed for muscle spasms. This was sent to pharmacy. Will do this only for 7 days. Do not take ativan/lorazepam while taking this medication.  You can continue on robaxin as directed and as needed.  You can continue on celebrex 200mg  twice a day. If having any stomach upset. Hold this while on medrol dose pack.  Will add medrol dose pack to take as prescribed.

## 2022-10-12 NOTE — Progress Notes (Addendum)
   REFERRING PHYSICIAN:  No referring provider defined for this encounter.  DOS: 10/02/22 Extension of fusion to T2 with PSF T2-T10  HISTORY OF PRESENT ILLNESS: Bryce Williams is approximately 10 days status post extension of fusion to T2 with PSF T2-T10. Was given percocet 10 and robaxin on discharge from the hospital.   He was on percocet 10 prior to surgery and was taking 5 per day. He has called multiple times with complaints of pain.   He currently is taking percocet 10 q 4 hours alternating with dilaudid 2mg  q 4 hours. He is on robaxin and celebrex.   He continues with constant thoracic pain (around incision) that is not well controlled with current medications. No radicular pain into ribs. No arm or leg pain. No numbness, tingling, or weakness. He thinks pain has been worse since Monday.    PHYSICAL EXAMINATION:  General: Patient is well developed, well nourished, calm, collected, and in no apparent distress.    NEUROLOGICAL:  General: In no acute distress.   Awake, alert, oriented to person, place, and time.  Pupils equal round and reactive to light.  Facial tone is symmetric.     Strength:  Side Biceps Triceps Deltoid Interossei Grip Wrist Ext. Wrist Flex.  R 5 5 5 5 5 5 5   L 5 5 5 5 5 5 5            Side Iliopsoas Quads Hamstring PF DF EHL  R 5 5 5 5 5 5   L 5 5 5 5 5 5    Incision c/d/I  Sensation in bilateral upper and lower extremities is grossly intact.    ROS (Neurologic):  Negative except as noted above  IMAGING: Nothing new to review.   ASSESSMENT/PLAN:  Bryce Williams is having difficulty with postop pain control.  Dr. Izora Ribas was in to see the patient today as well.   Dr. Izora Ribas discussed treatment options with patient, his wife, and his daughter. Following plan made with him:   - Thoracic xrays on his way out. Will message him with results.  - Reviewed wound care.   - No bending, twisting, or lifting.   - He will start the following postop  medication regimen per Dr. Izora Ribas:  Will take percocet 10/325 q 4 hours prn pain. Refill sent to pharmacy.  Dilaudid will be increased to 2-4mg  q 4 hours prn pain (will use this for breakthrough pain). Refill sent to pharmacy.  Will add valium 5mg  q 8 hours prn spasms for 7 days only. Do not take ativan/lorazepam with this (he takes prn). Okay to take atarax as needed.  He can continue on robaxin prn. Discussed he may not need with valium.  He can continue on celebrex 200mg  bid.  Will add medrol dose pack to take as prescribed.   - Follow up for postop phone visit with Andee Poles as scheduled on Tuesday.   Advised to contact the office if any questions or concerns arise.  Xrays of thoracic spine dated 10/12/22 were reviewed with Dr. Izora Ribas and show no complications. Report not back yet. Sent him a Ar[employer].    Geronimo Boot PA-C Department of neurosurgery

## 2022-10-12 NOTE — Telephone Encounter (Signed)
They called to make sure that the message was received.

## 2022-10-12 NOTE — Telephone Encounter (Signed)
Spoke with patient and his wife. Scheduled appointment for him to see me at 11:30 today.

## 2022-10-13 ENCOUNTER — Telehealth: Payer: Self-pay | Admitting: Orthopedic Surgery

## 2022-10-13 ENCOUNTER — Telehealth: Payer: Self-pay

## 2022-10-13 NOTE — Telephone Encounter (Signed)
-----  Message from [place] sent at 10/13/2022  3:22 PM EST ----- Regarding: [place] looking for approval of services [place]  Nursing for 1 x 5  1 every 2 x 4 with PRN for medication management and recovery   ##Periodic episodes of sweating starting in the hospital and has continued and family wanted to double check that this was ok.

## 2022-10-13 NOTE — Telephone Encounter (Signed)
-----  Message from Peggyann Sho[place] sent at 10/13/2022  1:04 PM EST ----- Regarding: [facility]- medication management Contact: [phone] extension of fusion to T2 on 10/02/22 Mindy from [facility] 12:04pm Verbal order to add a nurse visit for medication management due to that he is taking a lot of pain medication. Wife is concerned that he is may overdose. Should patient be admitted to the hospital for pain management? Please call Mindy back.

## 2022-10-13 NOTE — Telephone Encounter (Signed)
Called and spoke with wife and it appears that his pain is better controlled on pain regimen we discussed yesterday. He was able to walk in yard and take a bath.   Wife advised to stop celebrex (he is on lithium). This was reviewed with Dr. Izora Ribas and nothing further recommended.

## 2022-10-13 NOTE — Telephone Encounter (Signed)
Spoke with wife. He is doing much better.   She is to only give him the dilaudid and valium as needed and not around the clock.   If he is having no pain, she can start to spread out the percocet as well.   Reviewed sweating with Dr. Izora Ribas earlier- likely due to pain and not concerning.   Spoke with home health and gave okay for home nurse schedule.

## 2022-10-16 ENCOUNTER — Telehealth: Payer: Self-pay | Admitting: Orthopedic Surgery

## 2022-10-16 NOTE — Telephone Encounter (Signed)
Called to check on him.   He and wife state that is pain is much better. He only had a few dilaudid over the weekend. Still doing percocet 10 and valium.   He states pain is better but he is still hurting.   He will keep phone visit scheduled with [facility] tomorrow.

## 2022-10-17 ENCOUNTER — Encounter: Payer: Medicare Other | Admitting: Neurosurgery

## 2022-10-17 ENCOUNTER — Ambulatory Visit (INDEPENDENT_AMBULATORY_CARE_PROVIDER_SITE_OTHER): Payer: Medicare Other | Admitting: Neurosurgery

## 2022-10-17 DIAGNOSIS — Z09 Encounter for follow-up examination after completed treatment for conditions other than malignant neoplasm: Secondary | ICD-10-CM

## 2022-10-17 DIAGNOSIS — M960 Pseudarthrosis after fusion or arthrodesis: Secondary | ICD-10-CM

## 2022-10-17 DIAGNOSIS — M5134 Other intervertebral disc degeneration, thoracic region: Secondary | ICD-10-CM

## 2022-10-17 DIAGNOSIS — Z981 Arthrodesis status: Secondary | ICD-10-CM

## 2022-10-17 NOTE — Progress Notes (Signed)
Neurosurgery Telephone (Audio-Only) Note  Requesting Provider     Dan Maker, MD [address] [place],  [place] [address] T: [phone] F: [phone]  Primary Care Provider Dan Maker, MD [place] [address] T: [phone] F: [phone]  Telehealth visit was conducted with Bryce Williams, a 62 y.o. male via telephone.  History of Present Illness: Mr. Bernards is a 62 y.o s/p T2-10 PSF on 10/02/22. He reports improvement in his pain level since changing medications. He continues to take Percocet, Robaxin, and Valium as needed. While he did report sweats and chills since surgery, these are improvement and he has been without fever or sign of infection.  General Review of Systems:  A ROS was performed including pertinent positive and negatives as documented.  All other systems are negative.   Prior to Admission medications   Medication Sig Start Date End Date Taking? Authorizing Provider  ARIPiprazole (ABILIFY) 20 MG tablet Take 20 mg by mouth at bedtime.    [provider]  atorvastatin (LIPITOR) 40 MG tablet Take 40 mg by mouth in the morning.    [provider]  diazepam (VALIUM) 5 MG tablet Take 1 tablet (5 mg total) by mouth every 8 (eight) hours as needed for up to 7 days for muscle spasms. Do not take ativan while taking this medication. 10/12/22 10/19/22  Geronimo Boot, PA-C  DULoxetine (CYMBALTA) 60 MG capsule Take 60 mg by mouth 2 (two) times daily.    [provider]  hydrochlorothiazide (HYDRODIURIL) 25 MG tablet Take 25 mg by mouth in the morning.    [provider]  HYDROmorphone (DILAUDID) 2 MG tablet Take 1-2 tablets (2-4 mg total) by mouth every 4 (four) hours as needed (for severe postop breakthrough pain). 10/12/22 10/12/23  Geronimo Boot, PA-C  hydrOXYzine (ATARAX) 25 MG tablet Take 25 mg by mouth 3 (three) times daily as needed for anxiety.    [provider]   levETIRAcetam (KEPPRA) 500 MG tablet Take 500 mg by mouth 2 (two) times daily.    [provider]  lithium carbonate 300 MG capsule Take 300 mg by mouth daily. 2 tablets at qhs    [provider]  LORazepam (ATIVAN) 0.5 MG tablet Take 0.5 mg by mouth 3 (three) times daily as needed for anxiety. 09/19/22   [provider]  methocarbamol 1000 MG TABS Take 1,000 mg by mouth 3 (three) times daily. 10/05/22   Loleta Dicker, PA  methylPREDNISolone (MEDROL DOSEPAK) 4 MG TBPK tablet Use as directed x 6 days. 10/12/22   Geronimo Boot, PA-C  naloxone HCl [facility]) 4 MG/10ML SOLN injection Inject into the vein as needed.    [provider]  ondansetron (ZOFRAN) 4 MG tablet Take 4 mg by mouth every 8 (eight) hours as needed for nausea or vomiting.    [provider]  oxyCODONE-acetaminophen (PERCOCET) 10-325 MG tablet Take 1 tablet by mouth every 4 (four) hours as needed (severe postop pain). 10/12/22   Geronimo Boot, PA-C  propranolol (INDERAL) 20 MG tablet Take 10-20 mg by mouth 2 (two) times daily as needed (anxiety or restlessness). 09/20/22   [provider]  senna (SENOKOT) 8.6 MG TABS tablet Take 1 tablet (8.6 mg total) by mouth 2 (two) times daily. 10/05/22   Loleta Dicker, PA  senna-docusate (SENOKOT-S) 8.6-50 MG tablet Take 1-2 tablets by mouth 2 (two) times daily as needed for mild constipation (take if no  BM within 24 hours). 11/19/21   Meade Maw, MD  traZODone (DESYREL) 100 MG tablet Take 200 mg by mouth at bedtime.    [provider]    DATA REVIEWED    Imaging Studies  Xrays 10/12/22 Without evidence of hardware malfunction  IMPRESSION  Mr. Holderman is a 62 y.o. male who I performed a telephone encounter today for post-op follow up via telephone visit  PLAN  We discussed that further valium for muscle relaxation would likely not be useful. He is going to wean off of this. We discussed only taking Percocet as needed. He will  continue to take Robaxin but I suggested that he alternate this with the Percocet instead of taking them together to see if this provides some additional coverage. He was instructed to restart his Eliquis since he is 14 days post-op. We discussed activity escalation as well and he is to continue avoiding BLTs and lifting greater than 10 lbs. He was instructed to call with any questions or concerns otherwise we will keep his regularly scheduled follow up with Dr. Izora Ribas in 4 weeks.   No orders of the defined types were placed in this encounter.   DISPOSITION  Follow up: In person appointment in 1 month  [place], PA   TELEPHONE DOCUMENTATION   This visit was performed via telephone.  Patient location: home Provider location: office  I spent a total of 15 minutes non-face-to-face activities for this visit on the date of this encounter including review of current clinical condition and response to treatment.  The patient is aware of and accepts the limits of this telehealth visit.

## 2022-10-18 ENCOUNTER — Telehealth: Payer: Self-pay

## 2022-10-18 NOTE — Telephone Encounter (Signed)
Spoke with wife. Pain continues to be more controlled.   He is basically taking something every 2 hours.   Example:  7am percocet 9am robaxin 11am percocet 1 pm 1/2 valium 3 pm percocet 5pm robaxin 7pm percocet 9pm 1/2 valium 11pm percocet 1am robaxin 3am percocet  5am 1/2 valium 7am percocet  When he is out of valium, he can substitute the  1/2 of dilaudid for severe break through pain if needed.   She will call with concerns.

## 2022-10-18 NOTE — Telephone Encounter (Signed)
-----  Message from [place] sent at 10/18/2022  9:32 AM EST ----- Regarding: Medication question Spoke with Andee Poles yesterday:  "We discussed that further valium for muscle relaxation would likely not be useful. He is going to wean off of this. We discussed only taking Percocet as needed. He will continue to take Robaxin but I suggested that he alternate this with the Percocet instead of taking them together to see if this provides some additional coverage. He was instructed to restart his Eliquis since he is 14 days post-op. We discussed activity escalation as well and he is to continue avoiding BLTs and lifting greater than 10 lbs. He was instructed to call with any questions or concerns otherwise we will keep his regularly scheduled follow up with Dr. Izora Ribas in 4 weeks."  Wife call and has written down the schedule for how often he should be taking certain meds and when he should be taking them but wanted to speak with Marzetta Board as she was curious about an option for breakthrough pain?

## 2022-10-20 ENCOUNTER — Telehealth: Payer: Self-pay

## 2022-10-20 NOTE — Telephone Encounter (Signed)
-----   Message from [place] sent at 10/20/2022  1:43 PM EST ----- Regarding: Still having pain Call back: [phone] Bryce Williams called in wanting to inform Dr. Darreld Mclean or one of our PA's that he is still experiencing pain especially at night when trying to settle. He wanted advise or thoughts on what he should do or what could be done.

## 2022-10-27 ENCOUNTER — Other Ambulatory Visit: Payer: Self-pay | Admitting: Neurosurgery

## 2022-10-27 MED ORDER — OXYCODONE-ACETAMINOPHEN 10-325 MG PO TABS: 1.0000 | ORAL_TABLET | ORAL | 0 refills | Status: DC | PRN

## 2022-11-03 ENCOUNTER — Other Ambulatory Visit: Payer: Self-pay | Admitting: Neurosurgery

## 2022-11-03 MED ORDER — OXYCODONE-ACETAMINOPHEN 10-325 MG PO TABS: 1.0000 | ORAL_TABLET | ORAL | 0 refills | Status: DC | PRN

## 2022-11-03 NOTE — Telephone Encounter (Signed)
Bryce Williams called to make sure that the patient is able to receive the refill today to his pharmacy. [facility]

## 2022-11-07 ENCOUNTER — Other Ambulatory Visit: Payer: Self-pay | Admitting: Neurosurgery

## 2022-11-07 MED ORDER — METHOCARBAMOL 1000 MG PO TABS: 1000.0000 mg | ORAL_TABLET | Freq: Three times a day (TID) | ORAL | 0 refills | Status: DC

## 2022-11-10 ENCOUNTER — Other Ambulatory Visit: Payer: Self-pay | Admitting: Neurosurgery

## 2022-11-10 MED ORDER — OXYCODONE-ACETAMINOPHEN 10-325 MG PO TABS: 1.0000 | ORAL_TABLET | ORAL | 0 refills | Status: DC | PRN

## 2022-11-14 ENCOUNTER — Encounter: Payer: Medicare Other | Admitting: Neurosurgery

## 2022-11-15 ENCOUNTER — Other Ambulatory Visit: Payer: Self-pay

## 2022-11-15 DIAGNOSIS — Z981 Arthrodesis status: Secondary | ICD-10-CM

## 2022-11-16 ENCOUNTER — Ambulatory Visit (INDEPENDENT_AMBULATORY_CARE_PROVIDER_SITE_OTHER): Payer: Medicare Other | Admitting: Neurosurgery

## 2022-11-16 ENCOUNTER — Ambulatory Visit
Admission: RE | Admit: 2022-11-16 | Discharge: 2022-11-16 | Disposition: A | Discharge status: Home / Self Care | Payer: Medicare Other | Source: Ambulatory Visit | Attending: Neurosurgery | Admitting: Neurosurgery

## 2022-11-16 ENCOUNTER — Ambulatory Visit
Admission: RE | Admit: 2022-11-16 | Discharge: 2022-11-16 | Disposition: A | Discharge status: Home / Self Care | Payer: Medicare Other | Attending: Neurosurgery | Admitting: Neurosurgery

## 2022-11-16 VITALS — BP 151/87 | HR 69 | Temp 97.9°F | Wt 253.2 lb

## 2022-11-16 DIAGNOSIS — M960 Pseudarthrosis after fusion or arthrodesis: Secondary | ICD-10-CM

## 2022-11-16 DIAGNOSIS — M5134 Other intervertebral disc degeneration, thoracic region: Secondary | ICD-10-CM

## 2022-11-16 DIAGNOSIS — Z981 Arthrodesis status: Secondary | ICD-10-CM | POA: Insufficient documentation

## 2022-11-16 DIAGNOSIS — M5489 Other dorsalgia: Secondary | ICD-10-CM | POA: Insufficient documentation

## 2022-11-16 DIAGNOSIS — Z09 Encounter for follow-up examination after completed treatment for conditions other than malignant neoplasm: Secondary | ICD-10-CM

## 2022-11-16 MED ORDER — OXYCODONE-ACETAMINOPHEN 10-325 MG PO TABS: 1.0000 | ORAL_TABLET | ORAL | 0 refills | Status: AC | PRN

## 2022-11-16 MED ORDER — METHOCARBAMOL 1000 MG PO TABS: 1000.0000 mg | ORAL_TABLET | Freq: Three times a day (TID) | ORAL | 0 refills | Status: DC

## 2022-11-16 NOTE — Progress Notes (Signed)
   REFERRING PHYSICIAN:  Dan Maker, Md [address] [place],  [place] [address]  DOS: 10/02/22 Extension of fusion to T2 with PSF T2-T10  HISTORY OF PRESENT ILLNESS: Bryce Williams is status post extension of fusion to T2 with PSF T2-T10.  His pain is much improved.   PHYSICAL EXAMINATION:  General: Patient is well developed, well nourished, calm, collected, and in no apparent distress.    NEUROLOGICAL:  General: In no acute distress.   Awake, alert, oriented to person, place, and time.  Pupils equal round and reactive to light.  Facial tone is symmetric.     Strength:  Side Biceps Triceps Deltoid Interossei Grip Wrist Ext. Wrist Flex.  R 5 5 5 5 5 5 5  $ L 5 5 5 5 5 5 5           $ Side Iliopsoas Quads Hamstring PF DF EHL  R 5 5 5 5 5 5  $ L 5 5 5 5 5 5   $ Incision c/d/I  Sensation in bilateral upper and lower extremities is grossly intact.    ROS (Neurologic):  Negative except as noted above  IMAGING: No complications noted  ASSESSMENT/PLAN:  Bryce Williams is doing well.  I think he will continue to improve.  He is now essentially on his baseline pain medications.  He sees his pain management doctor again in a few weeks.  Will get him to his pain management doctor on his current prescriptions.  I will see him back in 6 weeks.  If he continues to do well, we will change him to a telephone visit.  Meade Maw MD Department of neurosurgery

## 2022-12-05 ENCOUNTER — Encounter: Payer: Self-pay | Admitting: Neurosurgery

## 2022-12-05 ENCOUNTER — Other Ambulatory Visit: Payer: Self-pay | Admitting: Neurosurgery

## 2022-12-05 MED ORDER — GABAPENTIN 300 MG PO CAPS: 300.0000 mg | ORAL_CAPSULE | Freq: Every day | ORAL | 0 refills | Status: AC

## 2022-12-14 DIAGNOSIS — Z981 Arthrodesis status: Secondary | ICD-10-CM

## 2022-12-14 DIAGNOSIS — M960 Pseudarthrosis after fusion or arthrodesis: Secondary | ICD-10-CM

## 2022-12-14 MED ORDER — METHOCARBAMOL 1000 MG PO TABS: 1000.0000 mg | ORAL_TABLET | Freq: Three times a day (TID) | ORAL | 0 refills | Status: AC | PRN

## 2022-12-15 ENCOUNTER — Telehealth: Payer: Self-pay

## 2022-12-15 NOTE — Telephone Encounter (Signed)
"  Approved. This drug has been approved under the Member's Medicare Part D benefit. Approved quantity: 180 units per 30 day(s). You may fill up to a 90 day supply except for those on Specialty Tier 5, which can be filled up to a 30 day supply. Please call the pharmacy to process the prescription claim. Authorization Expiration Date: 09/17/2098"

## 2022-12-15 NOTE — Telephone Encounter (Signed)
Received notification from Cover[employer] that methocarbamol requires prior authorization. The authorization was for methocarbamol 500mg , 180 tablets (instead of methocarbamol 1000mg , 90 tablets). I spoke with the pharmacy about this discrepancy. They do not carry 1000mg  tablets. They also reported the patient has been paying cash for the previous rx's.   I have submitted the authorization request on Covermymeds. It is currently under review. Key: [id number]

## 2022-12-20 ENCOUNTER — Other Ambulatory Visit: Payer: Self-pay

## 2022-12-20 DIAGNOSIS — M960 Pseudarthrosis after fusion or arthrodesis: Secondary | ICD-10-CM

## 2022-12-20 NOTE — Progress Notes (Signed)
   REFERRING PHYSICIAN:  Paula Libra, Md [address] [place],  [place] [address]  DOS: 10/02/22 Extension of fusion to T2 with PSF T2-T10   HISTORY OF PRESENT ILLNESS: Bryce Williams is approximately 2.5 months status post spinal fusion. he is doing fair post-operatively. He continues to have interscapular pain particularly with movement. He states this is largely the same as it was at his last visit. He is currently taking Oxycodone 10mg  5 times per day and Robaxin. He is also using ice and heat which help.   LOV 11/16/22  Bascom Levels is status post extension of fusion to T2 with PSF T2-T10.  His pain is much improved   PHYSICAL EXAMINATION:  General: Patient is well developed, well nourished, calm, collected, and in no apparent distress.   NEUROLOGICAL:  General: In no acute distress.   Awake, alert, oriented to person, place, and time.  Pupils equal round and reactive to light.  Facial tone is symmetric.   Strength:            Side Iliopsoas Quads Hamstring PF DF EHL  R 5 5 5 5 5 5   L 5 5 5 5 5 5    Incision well healed    ROS (Neurologic):  Negative except as noted above  IMAGING: 12/21/22 thoracic xrays No interval changes from his last x-rays.  ASSESSMENT/PLAN:  KEVONTAY VOLIVA is doing fair approximately 2.5 months after extension of fusion.  He continues to have interscapular pain which is affecting his mobility.  It sounds muscular in nature and I discussed potentially dry needling or trigger point injections with him however I would like to discuss this further with Dr. Marcell Barlow.  I will contact him via MyChart per his request with the results of this conversation.  Should this be a reasonable next step, the patient would like to do these closer to him in [place].  He expressed understanding and was in agreement with this plan.  He is cleared to resume normal activities as tolerated.  he will follow up with Dr. Marcell Barlow in 3 months with x-rays  prior. Advised to contact the office if any questions or concerns arise.  Manning Charity PA-C Department of neurosurgery

## 2022-12-21 ENCOUNTER — Ambulatory Visit
Admission: RE | Admit: 2022-12-21 | Discharge: 2022-12-21 | Disposition: A | Discharge status: Home / Self Care | Payer: Medicare Other | Source: Ambulatory Visit | Attending: Neurosurgery | Admitting: Neurosurgery

## 2022-12-21 ENCOUNTER — Encounter: Payer: Self-pay | Admitting: Neurosurgery

## 2022-12-21 ENCOUNTER — Ambulatory Visit (INDEPENDENT_AMBULATORY_CARE_PROVIDER_SITE_OTHER): Payer: Medicare Other | Admitting: Neurosurgery

## 2022-12-21 ENCOUNTER — Ambulatory Visit
Admission: RE | Admit: 2022-12-21 | Discharge: 2022-12-21 | Disposition: A | Discharge status: Home / Self Care | Payer: Medicare Other | Attending: Neurosurgery | Admitting: Neurosurgery

## 2022-12-21 VITALS — BP 134/84 | Temp 98.2°F | Ht 68.0 in | Wt 253.0 lb

## 2022-12-21 DIAGNOSIS — M960 Pseudarthrosis after fusion or arthrodesis: Secondary | ICD-10-CM

## 2022-12-21 DIAGNOSIS — Z09 Encounter for follow-up examination after completed treatment for conditions other than malignant neoplasm: Secondary | ICD-10-CM

## 2022-12-21 DIAGNOSIS — Z981 Arthrodesis status: Secondary | ICD-10-CM

## 2022-12-21 DIAGNOSIS — M5134 Other intervertebral disc degeneration, thoracic region: Secondary | ICD-10-CM

## 2023-02-02 IMAGING — CT DG THORACIC SPINE 2V
4 of 6 series · 16 of 33 positions shown, 18 images · non-contrast
Comparison: None.

CLINICAL DATA: Extension of thoracic are fusion of T4.

EXAM:
THORACIC SPINE 2 VIEWS

[Series 6: — · sagittal · 0.36mm/px · 5 of 155 slices shown (1 of 4)]
[im 26/155  bone]
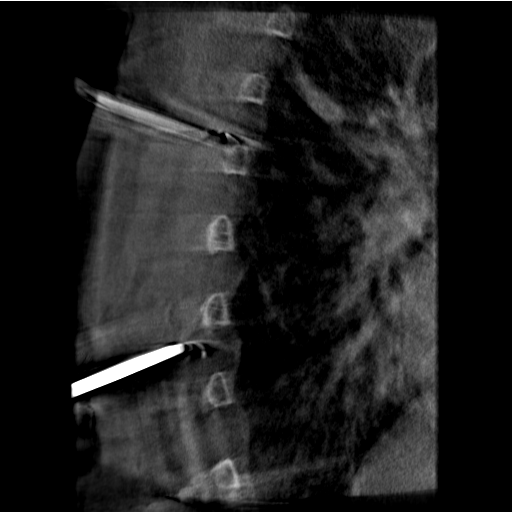
[im 52/155  bone]
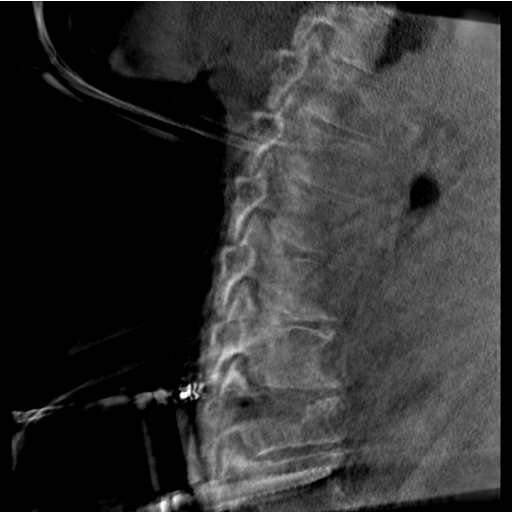
[im 78/155  bone]
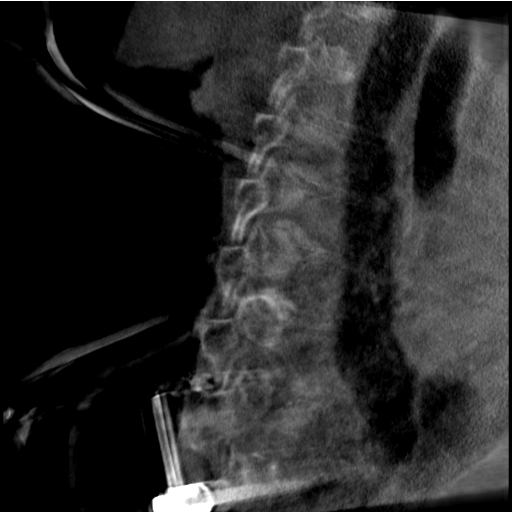
[im 103/155  bone]
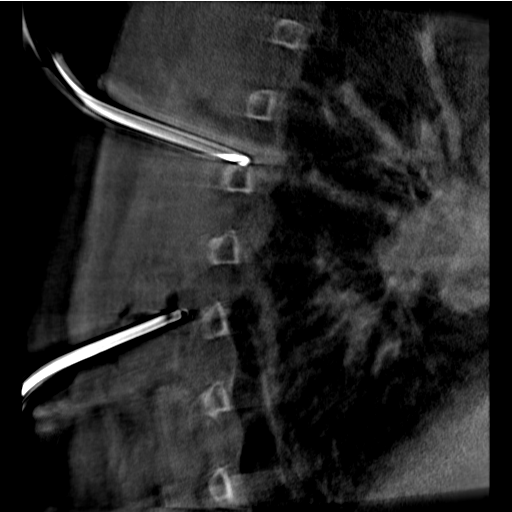
[im 129/155  bone]
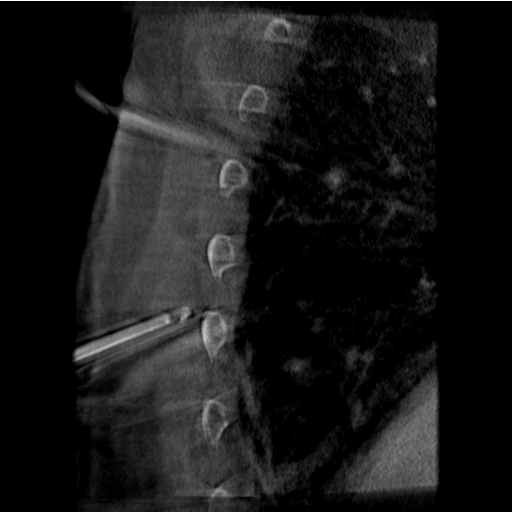

[Series 6: — · axial · 0.36mm/px · z∈[-61,+59]mm · 5 of 170 slices shown, 7 images (2 of 4)]
[im 29/170  soft-tissue]
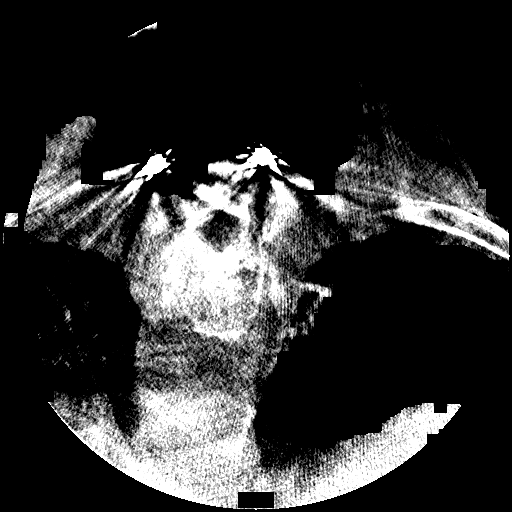
[im 29/170  bone]
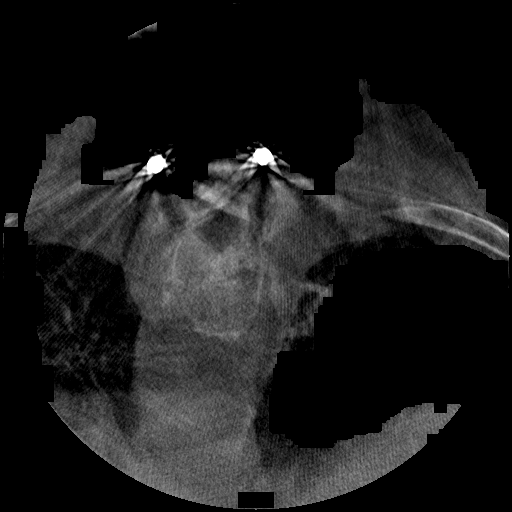
[im 57/170  bone]
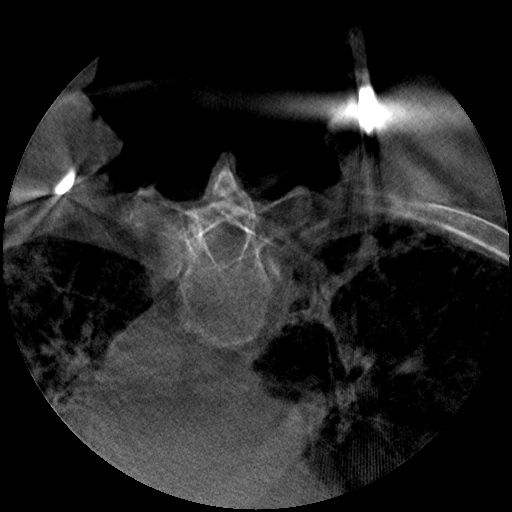
[im 85/170  bone]
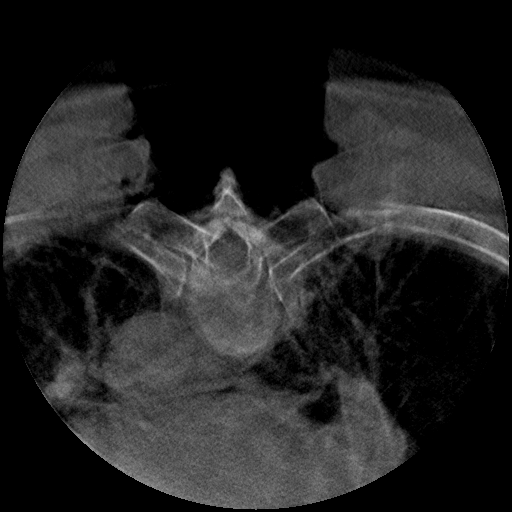
[im 113/170  bone]
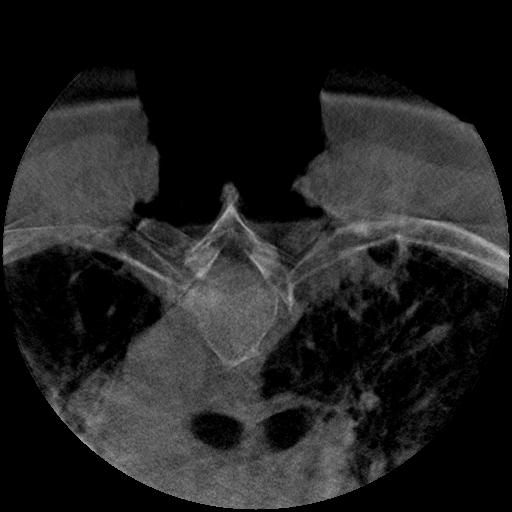
[im 141/170  soft-tissue]
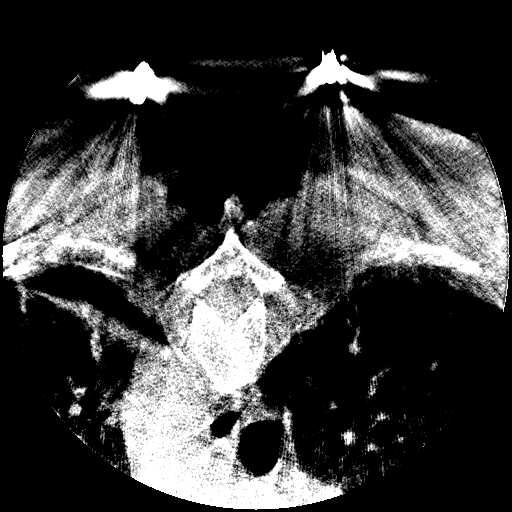
[im 141/170  bone]
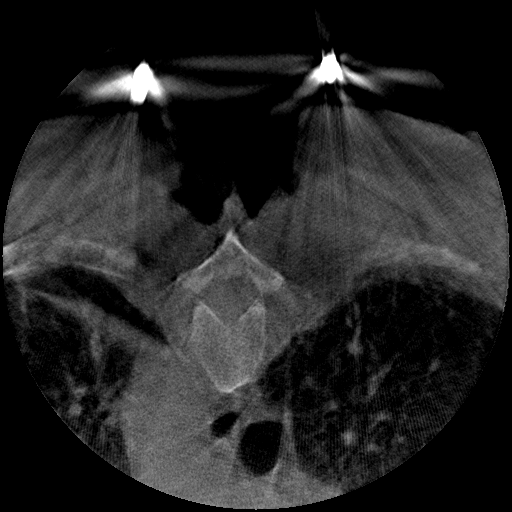

[Series 7: — · coronal · 0.36mm/px · 1 of 118 slices shown (3 of 4)]
[im 59/118  bone]
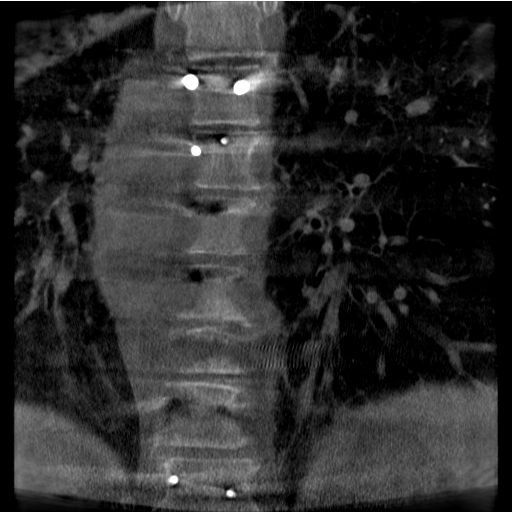

[Series 7: — · axial · 0.36mm/px · z∈[-61,+59]mm · 5 of 170 slices shown (4 of 4)]
[im 29/170  bone]
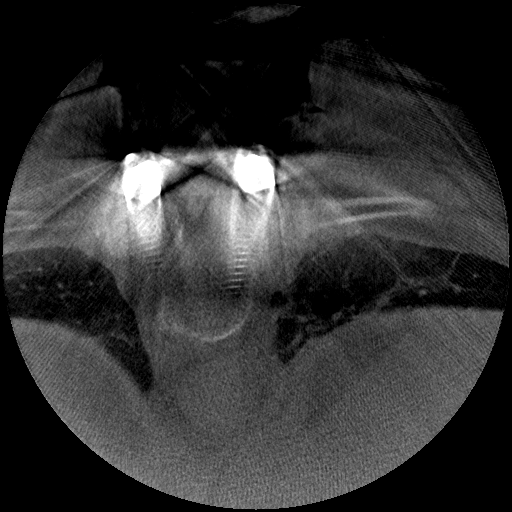
[im 57/170  bone]
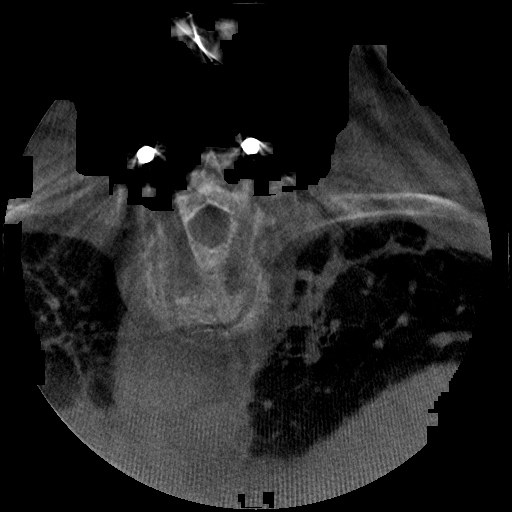
[im 85/170  bone]
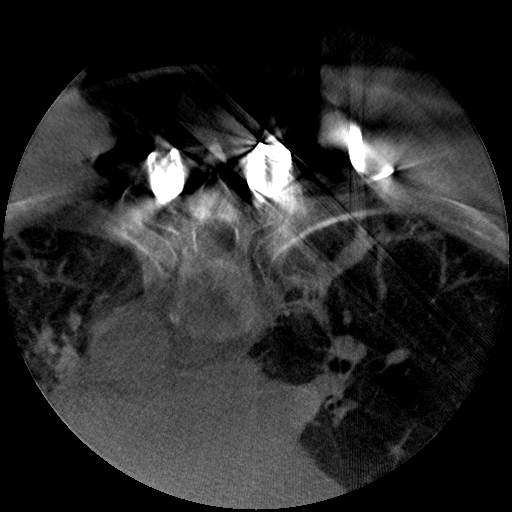
[im 113/170  bone]
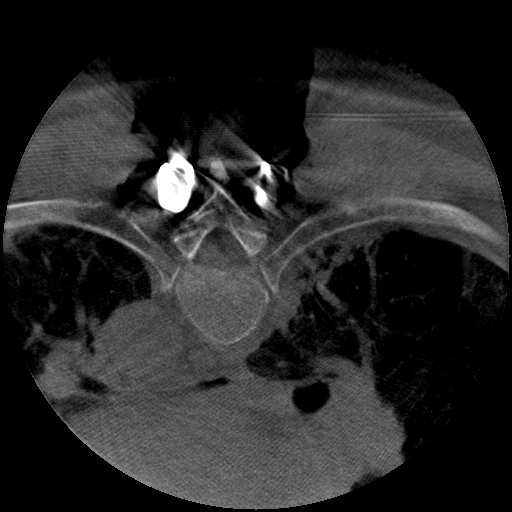
[im 141/170  bone]
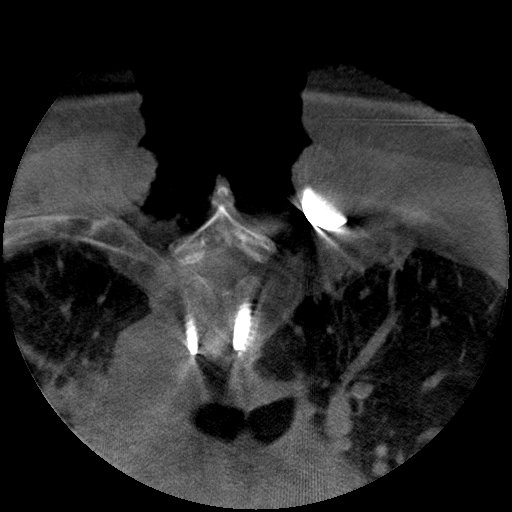

[16 of 33 positions shown; findings below may reference images not displayed]

FINDINGS: Images were obtained during pedicle screw placement above and below
a thoracic vertebral body fracture. Visualized hardware is in good
position.
IMPRESSION: Hardware placement across the fracture in the thoracic spine as
above.

## 2023-03-29 ENCOUNTER — Ambulatory Visit: Payer: Medicare Other | Admitting: Neurosurgery

## 2023-04-04 ENCOUNTER — Other Ambulatory Visit: Payer: Self-pay

## 2023-04-04 DIAGNOSIS — M960 Pseudarthrosis after fusion or arthrodesis: Secondary | ICD-10-CM

## 2023-04-05 ENCOUNTER — Ambulatory Visit
Admission: RE | Admit: 2023-04-05 | Discharge: 2023-04-05 | Disposition: A | Discharge status: Home / Self Care | Payer: Medicare Other | Source: Ambulatory Visit | Attending: Neurosurgery | Admitting: Neurosurgery

## 2023-04-05 ENCOUNTER — Ambulatory Visit (INDEPENDENT_AMBULATORY_CARE_PROVIDER_SITE_OTHER): Payer: Medicare Other | Admitting: Neurosurgery

## 2023-04-05 ENCOUNTER — Encounter: Payer: Self-pay | Admitting: Neurosurgery

## 2023-04-05 ENCOUNTER — Ambulatory Visit
Admission: RE | Admit: 2023-04-05 | Discharge: 2023-04-05 | Disposition: A | Discharge status: Home / Self Care | Payer: Medicare Other | Attending: Neurosurgery | Admitting: Neurosurgery

## 2023-04-05 VITALS — BP 130/68 | Temp 98.2°F | Ht 67.0 in | Wt 253.0 lb

## 2023-04-05 DIAGNOSIS — Z981 Arthrodesis status: Secondary | ICD-10-CM | POA: Diagnosis not present

## 2023-04-05 DIAGNOSIS — M960 Pseudarthrosis after fusion or arthrodesis: Secondary | ICD-10-CM | POA: Insufficient documentation

## 2023-04-05 DIAGNOSIS — M5134 Other intervertebral disc degeneration, thoracic region: Secondary | ICD-10-CM

## 2023-04-05 DIAGNOSIS — Z09 Encounter for follow-up examination after completed treatment for conditions other than malignant neoplasm: Secondary | ICD-10-CM | POA: Diagnosis not present

## 2023-04-05 NOTE — Progress Notes (Signed)
   REFERRING PHYSICIAN:  No referring provider defined for this encounter.  DOS: 10/02/22 Extension of fusion to T2 with PSF T2-T10   HISTORY OF PRESENT ILLNESS: Bryce Williams is status post spinal fusion.  His pain has been slowly worsening over time.  He is having pain as bad as 9 out of 10 in between his shoulder blades.    PHYSICAL EXAMINATION:  General: Patient is well developed, well nourished, calm, collected, and in no apparent distress.   NEUROLOGICAL:  General: In no acute distress.   Awake, alert, oriented to person, place, and time.  Pupils equal round and reactive to light.  Facial tone is symmetric.   Strength:            Moves all extremities well  Incision well healed    ROS (Neurologic):  Negative except as noted above  IMAGING: 12/21/22 thoracic xrays No interval changes from his last x-rays.  04/05/23 x-rays of thoracic spine There appears to be screw pullout at T2 with angulation at T2-3.  ASSESSMENT/PLAN:  Bryce Williams is doing worse after extension of fusion.  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  I would like to get a CT to confirm.  We discussed that he may need additional intervention.  If he elects for this, I would recommend he consider pursuing surgery at an institution with an inpatient pain service given his difficulty with pain management after the last surgery.  He expressed understanding.  Venetia Night MD Department of neurosurgery

## 2023-04-12 ENCOUNTER — Telehealth: Payer: Self-pay

## 2023-04-12 DIAGNOSIS — M960 Pseudarthrosis after fusion or arthrodesis: Secondary | ICD-10-CM

## 2023-04-12 NOTE — Telephone Encounter (Signed)
[facility] request sent 

## 2023-04-12 NOTE — Telephone Encounter (Signed)
-----   Message from Patrisia C sent at 04/12/2023 11:57 AM EDT ----- His CT thoracic from [facility] is in care everywhere

## 2023-04-13 ENCOUNTER — Encounter: Payer: Self-pay | Admitting: Neurosurgery

## 2023-04-16 ENCOUNTER — Inpatient Hospital Stay
Admission: RE | Admit: 2023-04-16 | Discharge: 2023-04-16 | Disposition: A | Discharge status: Home / Self Care | Payer: Self-pay | Source: Ambulatory Visit | Attending: Neurosurgery | Admitting: Neurosurgery

## 2023-04-16 ENCOUNTER — Other Ambulatory Visit: Payer: Self-pay

## 2023-04-16 DIAGNOSIS — Z049 Encounter for examination and observation for unspecified reason: Secondary | ICD-10-CM

## 2023-04-16 NOTE — Telephone Encounter (Signed)
Images have been loaded to [facility]. He does not have a follow up appt scheduled.

## 2023-04-20 NOTE — Telephone Encounter (Signed)
Referral faxed

## 2023-04-20 NOTE — Telephone Encounter (Signed)
Please move forward with the referral to Dr.Rocos.

## 2023-04-20 NOTE — Telephone Encounter (Signed)
Referral has been placed. 

## 2023-04-20 NOTE — Telephone Encounter (Signed)
Mr. Bryce Williams is calling back. Did Dr.Yarbrough have a chance to review his CT results? He is aware that Dr.Yarbrough is out

## 2023-04-24 NOTE — Telephone Encounter (Signed)
Referral faxed to [phone], the referral had not been received yet.

## 2023-05-03 ENCOUNTER — Encounter: Payer: Self-pay | Admitting: Neurosurgery

## 2023-05-03 NOTE — Telephone Encounter (Signed)
Pt's wife called today saying that she spoke with Dr. Billee Cashing' office and that they said they had still not received the referral. She went ahead and scheduled an appointment without the referral. She was concerned though that the office would not have the notes that went along with the referral and asked if I would f/u and told me that she spoke with a lady in their office named Jasmine and that she should be our point of contact. I called and left a message for [facility]. She returned my call same day and confirmed all the fax numbers it was sent to. Leavy Cella said to fax to [phone] as this was her personal fax number. She stated she had received something to that fax number (I'm assuming when Patty sent on 04/30/23) but that it did not include multiple patient identifiers so she was unable to process. I re-faxed the referral and included an extra face sheet with patient demographics to the number provided. Will call the patients wife back to update.

## 2023-05-14 ENCOUNTER — Encounter: Payer: Self-pay | Admitting: Neurosurgery

## 2023-05-14 NOTE — Telephone Encounter (Signed)
I spoke to patient and he was just making sure the referral to Dr. Esmond Harps office has been placed. I informed him that it was sent on 04/20/23. He has an appointment already scheduled on October 1. Patient voiced understanding.
